# Patient Record
Sex: Male | Born: 1980 | Race: White | Hispanic: No | Marital: Single | Smoking: Current every day smoker
Health system: Southern US, Community
[De-identification: ages and names within clinical notes are randomized; demographics above are authoritative.]

## PROBLEM LIST (undated history)

## (undated) DIAGNOSIS — M545 Low back pain, unspecified: Secondary | ICD-10-CM

## (undated) DIAGNOSIS — L03114 Cellulitis of left upper limb: Secondary | ICD-10-CM

## (undated) DIAGNOSIS — F1910 Other psychoactive substance abuse, uncomplicated: Secondary | ICD-10-CM

## (undated) DIAGNOSIS — G8929 Other chronic pain: Secondary | ICD-10-CM

## (undated) DIAGNOSIS — L02512 Cutaneous abscess of left hand: Secondary | ICD-10-CM

## (undated) HISTORY — PX: FASCIOTOMY: SHX132

---
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
## 2016-04-15 ENCOUNTER — Emergency Department (HOSPITAL_COMMUNITY)

## 2016-04-15 ENCOUNTER — Encounter (HOSPITAL_COMMUNITY): Payer: Self-pay | Admitting: Adult Health

## 2016-04-15 ENCOUNTER — Observation Stay (HOSPITAL_COMMUNITY): Admitting: Anesthesiology

## 2016-04-15 ENCOUNTER — Inpatient Hospital Stay (HOSPITAL_COMMUNITY)
Admission: EM | Admit: 2016-04-15 | Discharge: 2016-04-21 | DRG: 603 | Disposition: A | Discharge status: Home / Self Care | Attending: Internal Medicine | Admitting: Internal Medicine

## 2016-04-15 ENCOUNTER — Encounter (HOSPITAL_COMMUNITY)
Admission: EM | Disposition: A | Discharge status: Home / Self Care | Payer: Self-pay | Source: Home / Self Care | Attending: Internal Medicine

## 2016-04-15 DIAGNOSIS — L03114 Cellulitis of left upper limb: Secondary | ICD-10-CM | POA: Diagnosis not present

## 2016-04-15 DIAGNOSIS — F17200 Nicotine dependence, unspecified, uncomplicated: Secondary | ICD-10-CM | POA: Diagnosis present

## 2016-04-15 DIAGNOSIS — M7989 Other specified soft tissue disorders: Secondary | ICD-10-CM | POA: Diagnosis not present

## 2016-04-15 HISTORY — PX: I & D EXTREMITY: SHX5045

## 2016-04-15 LAB — BASIC METABOLIC PANEL
ANION GAP: 9 (ref 5–15)
BUN: 8 mg/dL (ref 6–20)
CHLORIDE: 104 mmol/L (ref 101–111)
CO2: 26 mmol/L (ref 22–32)
Calcium: 9.3 mg/dL (ref 8.9–10.3)
Creatinine, Ser: 1.02 mg/dL (ref 0.61–1.24)
GFR calc Af Amer: >60 mL/min (ref >60)
GFR calc non Af Amer: >60 mL/min (ref >60)
GLUCOSE: 99 mg/dL (ref 65–99)
POTASSIUM: 3.7 mmol/L (ref 3.5–5.1)
Sodium: 139 mmol/L (ref 135–145)

## 2016-04-15 LAB — CBC
HEMATOCRIT: 44.2 % (ref 39.0–52.0)
Hemoglobin: 13.5 g/dL (ref 13.0–17.0)
MCH: 27 pg (ref 26.0–34.0)
MCHC: 30.5 g/dL (ref 30.0–36.0)
MCV: 88.4 fL (ref 78.0–100.0)
Platelets: 160 10*3/uL (ref 150–400)
RBC: 5 MIL/uL (ref 4.22–5.81)
RDW: 14.9 % (ref 11.5–15.5)
WBC: 5.9 10*3/uL (ref 4.0–10.5)

## 2016-04-15 LAB — CBC WITH DIFFERENTIAL/PLATELET
BASOS ABS: 0 10*3/uL (ref 0.0–0.1)
Basophils Relative: 0 %
EOS PCT: 1 %
Eosinophils Absolute: 0.1 10*3/uL (ref 0.0–0.7)
HEMATOCRIT: 45.6 % (ref 39.0–52.0)
Hemoglobin: 14.6 g/dL (ref 13.0–17.0)
Lymphocytes Relative: 21 %
Lymphs Abs: 1.7 10*3/uL (ref 0.7–4.0)
MCH: 28 pg (ref 26.0–34.0)
MCHC: 32 g/dL (ref 30.0–36.0)
MCV: 87.4 fL (ref 78.0–100.0)
Monocytes Absolute: 0.6 10*3/uL (ref 0.1–1.0)
Monocytes Relative: 8 %
NEUTROS PCT: 70 %
Neutro Abs: 5.6 10*3/uL (ref 1.7–7.7)
Platelets: 177 10*3/uL (ref 150–400)
RBC: 5.22 MIL/uL (ref 4.22–5.81)
RDW: 14.8 % (ref 11.5–15.5)
WBC: 8 10*3/uL (ref 4.0–10.5)

## 2016-04-15 LAB — CREATININE, SERUM
Creatinine, Ser: 0.98 mg/dL (ref 0.61–1.24)
GFR calc non Af Amer: >60 mL/min (ref >60)

## 2016-04-15 SURGERY — IRRIGATION AND DEBRIDEMENT EXTREMITY
Anesthesia: General | Site: Arm Lower | Laterality: Left

## 2016-04-15 MED ORDER — METHOCARBAMOL 500 MG PO TABS
500.0000 mg | ORAL_TABLET | Freq: Four times a day (QID) | ORAL | Status: DC | PRN
  Administered 2016-04-15 – 2016-04-20 (×5): 500 mg via ORAL
  Filled 2016-04-15 (×9): qty 1

## 2016-04-15 MED ORDER — MEPERIDINE HCL 25 MG/ML IJ SOLN: 6.2500 mg | INTRAMUSCULAR | Status: DC | PRN

## 2016-04-15 MED ORDER — HYDROMORPHONE HCL 1 MG/ML IJ SOLN
0.2500 mg | INTRAMUSCULAR | Status: DC | PRN
  Administered 2016-04-15 (×2): 0.5 mg via INTRAVENOUS

## 2016-04-15 MED ORDER — FENTANYL CITRATE (PF) 250 MCG/5ML IJ SOLN
INTRAMUSCULAR | Status: AC
  Filled 2016-04-15: qty 5

## 2016-04-15 MED ORDER — ONDANSETRON HCL 4 MG/2ML IJ SOLN: 4.0000 mg | Freq: Four times a day (QID) | INTRAMUSCULAR | Status: DC | PRN

## 2016-04-15 MED ORDER — BUPIVACAINE HCL (PF) 0.25 % IJ SOLN
INTRAMUSCULAR | Status: DC | PRN
  Administered 2016-04-15: 10 mL

## 2016-04-15 MED ORDER — VANCOMYCIN HCL IN DEXTROSE 1-5 GM/200ML-% IV SOLN
1000.0000 mg | Freq: Three times a day (TID) | INTRAVENOUS | Status: DC
  Administered 2016-04-16 – 2016-04-17 (×5): 1000 mg via INTRAVENOUS
  Filled 2016-04-15 (×7): qty 200

## 2016-04-15 MED ORDER — LACTATED RINGERS IV SOLN
INTRAVENOUS | Status: DC | PRN
  Administered 2016-04-15 (×2): via INTRAVENOUS

## 2016-04-15 MED ORDER — MIDAZOLAM HCL 2 MG/2ML IJ SOLN
INTRAMUSCULAR | Status: AC
  Filled 2016-04-15: qty 2

## 2016-04-15 MED ORDER — LIDOCAINE HCL (CARDIAC) 20 MG/ML IV SOLN
INTRAVENOUS | Status: DC | PRN
  Administered 2016-04-15: 100 mg via INTRATRACHEAL

## 2016-04-15 MED ORDER — HYDROMORPHONE HCL 1 MG/ML IJ SOLN
0.5000 mg | INTRAMUSCULAR | Status: DC | PRN
  Administered 2016-04-15 – 2016-04-16 (×4): 1 mg via INTRAVENOUS
  Filled 2016-04-15 (×4): qty 1

## 2016-04-15 MED ORDER — PROPOFOL 10 MG/ML IV BOLUS
INTRAVENOUS | Status: DC | PRN
  Administered 2016-04-15: 200 mg via INTRAVENOUS

## 2016-04-15 MED ORDER — ONDANSETRON HCL 4 MG PO TABS: 4.0000 mg | ORAL_TABLET | Freq: Four times a day (QID) | ORAL | Status: DC | PRN

## 2016-04-15 MED ORDER — OXYCODONE-ACETAMINOPHEN 5-325 MG PO TABS
1.0000 | ORAL_TABLET | ORAL | Status: DC | PRN
  Administered 2016-04-15 – 2016-04-21 (×33): 2 via ORAL
  Filled 2016-04-15 (×36): qty 2

## 2016-04-15 MED ORDER — SODIUM CHLORIDE 0.9 % IR SOLN
Status: DC | PRN
  Administered 2016-04-15: 3000 mL

## 2016-04-15 MED ORDER — HYDROMORPHONE HCL 1 MG/ML IJ SOLN
INTRAMUSCULAR | Status: AC
  Filled 2016-04-15: qty 1

## 2016-04-15 MED ORDER — ENOXAPARIN SODIUM 40 MG/0.4ML ~~LOC~~ SOLN
40.0000 mg | SUBCUTANEOUS | Status: DC
  Administered 2016-04-16 – 2016-04-21 (×6): 40 mg via SUBCUTANEOUS
  Filled 2016-04-15 (×6): qty 0.4

## 2016-04-15 MED ORDER — LIDOCAINE 2% (20 MG/ML) 5 ML SYRINGE
INTRAMUSCULAR | Status: AC
  Filled 2016-04-15: qty 5

## 2016-04-15 MED ORDER — LACTATED RINGERS IV SOLN
INTRAVENOUS | Status: DC
Start: 2016-04-15 — End: 2016-04-21

## 2016-04-15 MED ORDER — FENTANYL CITRATE (PF) 250 MCG/5ML IJ SOLN
INTRAMUSCULAR | Status: DC | PRN
  Administered 2016-04-15 (×2): 50 ug via INTRAVENOUS

## 2016-04-15 MED ORDER — ONDANSETRON HCL 4 MG/2ML IJ SOLN
INTRAMUSCULAR | Status: AC
  Filled 2016-04-15: qty 2

## 2016-04-15 MED ORDER — HYDRALAZINE HCL 20 MG/ML IJ SOLN: 10.0000 mg | INTRAMUSCULAR | Status: DC | PRN

## 2016-04-15 MED ORDER — TEMAZEPAM 15 MG PO CAPS
15.0000 mg | ORAL_CAPSULE | Freq: Every evening | ORAL | Status: DC | PRN
  Filled 2016-04-15 (×3): qty 1

## 2016-04-15 MED ORDER — BUPIVACAINE HCL (PF) 0.25 % IJ SOLN
INTRAMUSCULAR | Status: AC
Start: 2016-04-15 — End: 2016-04-15
  Filled 2016-04-15: qty 30

## 2016-04-15 MED ORDER — VANCOMYCIN HCL IN DEXTROSE 1-5 GM/200ML-% IV SOLN
1000.0000 mg | Freq: Once | INTRAVENOUS | Status: AC
  Administered 2016-04-15: 1000 mg via INTRAVENOUS
  Filled 2016-04-15: qty 200

## 2016-04-15 MED ORDER — METHOCARBAMOL 1000 MG/10ML IJ SOLN: 500.0000 mg | Freq: Four times a day (QID) | INTRAVENOUS | Status: DC | PRN

## 2016-04-15 MED ORDER — LACTATED RINGERS IV SOLN: INTRAVENOUS | Status: DC

## 2016-04-15 MED ORDER — PROCHLORPERAZINE EDISYLATE 5 MG/ML IJ SOLN: 10.0000 mg | INTRAMUSCULAR | Status: DC | PRN

## 2016-04-15 MED ORDER — SODIUM CHLORIDE 0.9 % IV SOLN
3.0000 g | Freq: Three times a day (TID) | INTRAVENOUS | Status: DC
  Administered 2016-04-15 – 2016-04-17 (×5): 3 g via INTRAVENOUS
  Filled 2016-04-15 (×8): qty 3

## 2016-04-15 MED ORDER — ONDANSETRON HCL 4 MG/2ML IJ SOLN
INTRAMUSCULAR | Status: DC | PRN
  Administered 2016-04-15: 4 mg via INTRAVENOUS

## 2016-04-15 MED ORDER — DIPHENHYDRAMINE HCL 25 MG PO CAPS
25.0000 mg | ORAL_CAPSULE | Freq: Four times a day (QID) | ORAL | Status: DC | PRN
  Administered 2016-04-19: 50 mg via ORAL
  Filled 2016-04-15: qty 2

## 2016-04-15 MED ORDER — 0.9 % SODIUM CHLORIDE (POUR BTL) OPTIME
TOPICAL | Status: DC | PRN
  Administered 2016-04-15: 1000 mL

## 2016-04-15 MED ORDER — PROPOFOL 10 MG/ML IV BOLUS
INTRAVENOUS | Status: AC
  Filled 2016-04-15: qty 20

## 2016-04-15 MED ORDER — MORPHINE SULFATE (PF) 4 MG/ML IV SOLN
4.0000 mg | Freq: Once | INTRAVENOUS | Status: AC
  Administered 2016-04-15: 4 mg via INTRAVENOUS
  Filled 2016-04-15: qty 1

## 2016-04-15 MED ORDER — VITAMIN C 500 MG PO TABS
1000.0000 mg | ORAL_TABLET | Freq: Every day | ORAL | Status: DC
  Administered 2016-04-16 – 2016-04-21 (×6): 1000 mg via ORAL
  Filled 2016-04-15 (×6): qty 2

## 2016-04-15 SURGICAL SUPPLY — 60 items
BANDAGE ACE 3X5.8 VEL STRL LF (GAUZE/BANDAGES/DRESSINGS) ×3 IMPLANT
BANDAGE ACE 4X5 VEL STRL LF (GAUZE/BANDAGES/DRESSINGS) ×3 IMPLANT
BANDAGE COBAN STERILE 2 (GAUZE/BANDAGES/DRESSINGS) IMPLANT
BANDAGE ELASTIC 3 VELCRO ST LF (GAUZE/BANDAGES/DRESSINGS) ×3 IMPLANT
BANDAGE ELASTIC 4 VELCRO ST LF (GAUZE/BANDAGES/DRESSINGS) ×3 IMPLANT
BNDG COHESIVE 1X5 TAN STRL LF (GAUZE/BANDAGES/DRESSINGS) IMPLANT
BNDG CONFORM 2 STRL LF (GAUZE/BANDAGES/DRESSINGS) IMPLANT
BNDG ESMARK 4X9 LF (GAUZE/BANDAGES/DRESSINGS) IMPLANT
BNDG GAUZE ELAST 4 BULKY (GAUZE/BANDAGES/DRESSINGS) ×3 IMPLANT
CORDS BIPOLAR (ELECTRODE) ×3 IMPLANT
COVER SURGICAL LIGHT HANDLE (MISCELLANEOUS) ×6 IMPLANT
CUFF TOURNIQUET SINGLE 18IN (TOURNIQUET CUFF) ×3 IMPLANT
DECANTER SPIKE VIAL GLASS SM (MISCELLANEOUS) IMPLANT
DRAIN PENROSE 1/4X12 LTX STRL (WOUND CARE) IMPLANT
DRAPE SURG 17X23 STRL (DRAPES) ×3 IMPLANT
DRSG ADAPTIC 3X8 NADH LF (GAUZE/BANDAGES/DRESSINGS) ×3 IMPLANT
DRSG EMULSION OIL 3X3 NADH (GAUZE/BANDAGES/DRESSINGS) ×3 IMPLANT
DRSG PAD ABDOMINAL 8X10 ST (GAUZE/BANDAGES/DRESSINGS) ×3 IMPLANT
FACESHIELD WRAPAROUND (MASK) ×3 IMPLANT
GAUZE IODOFORM PACK 1/2 7832 (GAUZE/BANDAGES/DRESSINGS) ×6 IMPLANT
GAUZE SPONGE 4X4 12PLY STRL (GAUZE/BANDAGES/DRESSINGS) ×3 IMPLANT
GAUZE XEROFORM 1X8 LF (GAUZE/BANDAGES/DRESSINGS) ×3 IMPLANT
GLOVE BIO SURGEON STRL SZ7.5 (GLOVE) ×3 IMPLANT
GLOVE BIOGEL PI IND STRL 8 (GLOVE) ×1 IMPLANT
GLOVE BIOGEL PI INDICATOR 8 (GLOVE) ×2
GLOVE SURG SS PI 7.5 STRL IVOR (GLOVE) ×3 IMPLANT
GLOVE SURG SS PI 8.0 STRL IVOR (GLOVE) ×3 IMPLANT
GOWN STRL REUS W/ TWL LRG LVL3 (GOWN DISPOSABLE) ×1 IMPLANT
GOWN STRL REUS W/TWL LRG LVL3 (GOWN DISPOSABLE) ×2
KIT BASIN OR (CUSTOM PROCEDURE TRAY) ×3 IMPLANT
KIT ROOM TURNOVER OR (KITS) ×3 IMPLANT
LOOP VESSEL MAXI BLUE (MISCELLANEOUS) IMPLANT
MANIFOLD NEPTUNE II (INSTRUMENTS) ×3 IMPLANT
NEEDLE HYPO 25X1 1.5 SAFETY (NEEDLE) ×3 IMPLANT
NS IRRIG 1000ML POUR BTL (IV SOLUTION) ×3 IMPLANT
PACK ORTHO EXTREMITY (CUSTOM PROCEDURE TRAY) ×3 IMPLANT
PAD ARMBOARD 7.5X6 YLW CONV (MISCELLANEOUS) ×6 IMPLANT
PAD CAST 4YDX4 CTTN HI CHSV (CAST SUPPLIES) ×1 IMPLANT
PADDING CAST COTTON 4X4 STRL (CAST SUPPLIES) ×2
SCRUB BETADINE 4OZ XXX (MISCELLANEOUS) ×3 IMPLANT
SET CYSTO W/LG BORE CLAMP LF (SET/KITS/TRAYS/PACK) ×3 IMPLANT
SOLUTION BETADINE 4OZ (MISCELLANEOUS) ×6 IMPLANT
SPLINT PLASTER EXTRA FAST 3X15 (CAST SUPPLIES) ×2
SPLINT PLASTER GYPS XFAST 3X15 (CAST SUPPLIES) ×1 IMPLANT
SPONGE GAUZE 4X4 12PLY STER LF (GAUZE/BANDAGES/DRESSINGS) ×3 IMPLANT
SPONGE LAP 4X18 X RAY DECT (DISPOSABLE) ×3 IMPLANT
SUT ETHILON 4 0 P 3 18 (SUTURE) IMPLANT
SUT ETHILON 4 0 PS 2 18 (SUTURE) ×3 IMPLANT
SUT MON AB 5-0 P3 18 (SUTURE) IMPLANT
SWAB CULTURE LIQUID MINI MALE (MISCELLANEOUS) ×3 IMPLANT
SYR CONTROL 10ML LL (SYRINGE) IMPLANT
TOWEL OR 17X24 6PK STRL BLUE (TOWEL DISPOSABLE) ×3 IMPLANT
TOWEL OR 17X26 10 PK STRL BLUE (TOWEL DISPOSABLE) ×3 IMPLANT
TUBE ANAEROBIC SPECIMEN COL (MISCELLANEOUS) ×3 IMPLANT
TUBE CONNECTING 12'X1/4 (SUCTIONS) ×1
TUBE CONNECTING 12X1/4 (SUCTIONS) ×2 IMPLANT
TUBE FEEDING 5FR 15 INCH (TUBING) IMPLANT
UNDERPAD 30X30 INCONTINENT (UNDERPADS AND DIAPERS) ×6 IMPLANT
WATER STERILE IRR 1000ML POUR (IV SOLUTION) ×3 IMPLANT
YANKAUER SUCT BULB TIP NO VENT (SUCTIONS) ×3 IMPLANT

## 2016-04-15 NOTE — Anesthesia Postprocedure Evaluation (Signed)
Anesthesia Post Note  Patient: Derrick Rivers  Procedure(s) Performed: Procedure(s) (LRB): IRRIGATION AND DEBRIDEMENT LEFT HAND AND  FOREARM (Left)  Patient location during evaluation: PACU Anesthesia Type: General Level of consciousness: awake and alert Pain management: pain level controlled Vital Signs Assessment: post-procedure vital signs reviewed and stable Respiratory status: spontaneous breathing, nonlabored ventilation, respiratory function stable and patient connected to nasal cannula oxygen Cardiovascular status: blood pressure returned to baseline and stable Postop Assessment: no signs of nausea or vomiting Anesthetic complications: no    Last Vitals:  Filed Vitals:   04/15/16 2200 04/15/16 2205  BP: 175/98 158/94  Pulse: 83 82  Temp:  36.5 C  Resp: 16 15    Last Pain:  Filed Vitals:   04/15/16 2208  PainSc: 9                  Shelton SilvasKevin D Morrill Bomkamp

## 2016-04-15 NOTE — Anesthesia Procedure Notes (Signed)
Procedure Name: LMA Insertion Date/Time: 04/15/2016 8:55 PM Performed by: Melina SchoolsBANKS, Briella Hobday J Pre-anesthesia Checklist: Patient identified, Emergency Drugs available, Suction available, Patient being monitored and Timeout performed Patient Re-evaluated:Patient Re-evaluated prior to inductionOxygen Delivery Method: Circle system utilized Preoxygenation: Pre-oxygenation with 100% oxygen Intubation Type: IV induction Ventilation: Mask ventilation without difficulty LMA Size: 4.0 Number of attempts: 1 Placement Confirmation: positive ETCO2 and breath sounds checked- equal and bilateral Tube secured with: Tape Dental Injury: Teeth and Oropharynx as per pre-operative assessment

## 2016-04-15 NOTE — Discharge Instructions (Signed)

## 2016-04-15 NOTE — Progress Notes (Signed)
Pharmacy Antibiotic Note  Derrick Rivers is a 35 y.o. male admitted on 04/15/2016 with left hand infection.  Pharmacy has been consulted for Vancomycin and Unasyn dosing. Pt is s/p I&D today. WBC wnl, nCrCl > 100 mL/min. Patient has already received a dose of Vancomycin 1 gm at 1902  Plan: -Start Vancomycin 1 gm IV Q 8 hours -Unasyn 3 gm IV Q 8 hours -Monitor CBC, renal fx, cultures and clinical progress   Weight: 245 lb (111.131 kg)  Temp (24hrs), Avg:97.9 F (36.6 C), Min:97.7 F (36.5 C), Max:98.2 F (36.8 C)   Recent Labs Lab 04/15/16 1659  WBC 8.0  CREATININE 1.02    CrCl cannot be calculated (Unknown ideal weight.).    No Known Allergies  Antimicrobials this admission: 5/23 Vancomycin>> 5/23 Unasyn>>   Dose adjustments this admission: None   Microbiology results: 5/23 Abscess>>  Thank you for allowing pharmacy to be a part of this patient's care.  Derrick Rivers, PharmD., BCPS Clinical Pharmacist Pager [phone]

## 2016-04-15 NOTE — ED Provider Notes (Signed)
CSN: [id number]     Arrival date & time 04/15/16  1549 History   First MD Initiated Contact with Patient 04/15/16 1739     Chief Complaint  Patient presents with  . Arm Swelling     (Consider location/radiation/quality/duration/timing/severity/associated sxs/prior Treatment) HPI  Pt presenting with c/o left arm and hand swelling, pain and redness.  Pt states he first noted the top of his hand swollen when he awoke from sleep yesterday morning.  [facility] [place] [facility] was given keflex and bactrim yesterday - has had a total of 4 doses- states now the swelling and forearm are red and more swollen and more painful.  No fever/chills.  Does not know of any injury or break in skin where the redness started.  Pain with moving his hand and fingers.  He has had hx of fasciotomy.   History reviewed. No pertinent past medical history. Past Surgical History  Procedure Laterality Date  . Fasciotomy    . I&d extremity Left 04/15/2016    Procedure: IRRIGATION AND DEBRIDEMENT LEFT HAND AND  FOREARM;  Surgeon: Betha Loa, MD;  Location: [facility] OR;  Service: Orthopedics;  Laterality: Left;   History reviewed. No pertinent family history. Social History  Substance Use Topics  . Smoking status: Current Some Day Smoker  . Smokeless tobacco: None  . Alcohol Use: No    Review of Systems  ROS reviewed and all otherwise negative except for mentioned in HPI    Allergies  Review of patient's allergies indicates no known allergies.  Home Medications   Prior to Admission medications   Not on File   BP 130/82 mmHg  Pulse 79  Temp(Src) 98.6 F (37 C) (Oral)  Resp 16  Wt 245 lb (111.131 kg)  SpO2 99%  Vitals reviewed Physical Exam  Physical Examination: General appearance - alert, well appearing, and in no distress Mental status - alert, oriented to person, place, and time Eyes - no conjunctival injection, no scleral icterus Chest - clear to auscultation, no wheezes, rales or rhonchi,  symmetric air entry Heart - normal rate, regular rhythm, normal S1, S2, no murmurs, rubs, clicks or gallops Neurological - alert, oriented, normal speech, sensation intact in fingers distally, able to flex and extend finger but with significant pain Musculoskeletal - no joint tenderness, deformity or swelling Extremities - peripheral pulses normal, no pedal edema, no clubbing or cyanosis Skin - normal coloration and turgor, no rashes  ED Course  Procedures (including critical care time) Labs Review Labs Reviewed  CBC - Abnormal; Notable for the following:    MCHC 29.9 (*)    All other components within normal limits  AEROBIC/ANAEROBIC CULTURE(SURG SPECIMEN)(NOT AT [facility])  MRSA PCR SCREENING  CBC WITH DIFFERENTIAL/PLATELET  BASIC METABOLIC PANEL  CBC  CREATININE, SERUM  CBC    Imaging Review Dg Forearm Left  04/15/2016  CLINICAL DATA:  Swelling in the forearm. EXAM: LEFT FOREARM - 2 VIEW COMPARISON:  None. FINDINGS: The abnormal swelling in the dorsum of the hand and wrist continues along the dorsum and radial side of the forearm, where there is infiltration of the subcutaneous tissues along with soft tissue thickening. No foreign body observed. No underlying bony abnormality. IMPRESSION: 1. Abnormal edema tracking in the dorsum and radial side of the forearm. Cellulitis is not excluded. No gas in the soft tissues noted. Electronically Signed   By: Gaylyn Rong M.D.   On: 04/15/2016 18:15   Dg Hand Complete Left  04/15/2016  CLINICAL DATA:  Swelling and pain in the left hand beginning today. Tightness extending up to the elbow. Swelling. History of cellulitis. EXAM: LEFT HAND - COMPLETE 3+ VIEW COMPARISON:  None. FINDINGS: The patient flexed his fingers during imaging, reducing diagnostic sensitivity and specificity in assessing the fingers. I do not observe a foreign body. No bony destructive findings or fracture observed. There is extensive soft tissue swelling along the dorsum of  the hand without gas tracking in the soft tissues. This tracks into the dorsum of the wrist as well. Lateral sclerosis in the distal phalanx of the thumb, likely incidental. IMPRESSION: 1. Prominent dorsal soft tissue swelling in the hand and wrist, without underlying bony abnormality or visible foreign body. Electronically Signed   By: Gaylyn RongWalter  Liebkemann M.D.   On: 04/15/2016 18:14   I have personally reviewed and evaluated these images and lab results as part of my medical decision-making.   EKG Interpretation None      MDM   Final diagnoses:  None  left hand and forearm cellulitis  Pt presenting with swelling, redness and pain of left hand and forearm.  Labs obtained, WBC reassuring.  Pt started on IV abx, d/w Dr. Merlyn LotKuzma who is taking patient to the OR for washout.  D/w medicine for admission for IV abx.      6:05 PM went to see patient, he is in xray  6:45 PM d/w Dr. Merlyn LotKuzma, he is in the OR but will come see the patient.  IV abx ordered, pt to remain NPO.    7:49 PM Dr. Merlyn LotKuzma is going to take patient to the OR, requests hospitalist admission.    8:15 PM d/w Dr. Antionette Charpyd, hospitalist, for admission to med/surg bed.    Jerelyn ScottMartha Linker, MD 04/16/16 [id number]

## 2016-04-15 NOTE — ED Notes (Signed)
[facility] arm swelling began yesterday, started on Keflex and bactrim yesterday with no relief, swelling has increased. Arm tight, painful and very hot to touch. Swollen to elbow at this time. WBC at facility 12.2, jhx of fasciotomy in past.

## 2016-04-15 NOTE — Brief Op Note (Signed)
04/15/2016  9:38 PM  PATIENT:  Derrick Rivers  35 y.o. male  PRE-OPERATIVE DIAGNOSIS:  infection left hand  POST-OPERATIVE DIAGNOSIS:  infection left hand  PROCEDURE:  Procedure(s): IRRIGATION AND DEBRIDEMENT LEFT HAND AND  FOREARM (Left)  SURGEON:  Surgeon(s) and Role:    * Betha LoaKevin Sheikh Leverich, MD - Primary  PHYSICIAN ASSISTANT:   ASSISTANTS: none   ANESTHESIA:   general  EBL:  Total I/O In: 1000 [I.V.:1000] Out: 5 [Blood:5]  BLOOD ADMINISTERED:none  DRAINS: iodoform packing  LOCAL MEDICATIONS USED:  MARCAINE     SPECIMEN:  Source of Specimen:  left hand  DISPOSITION OF SPECIMEN:  micro  COUNTS:  YES  TOURNIQUET:   Total Tourniquet Time Documented: Upper Arm (Left) - 26 minutes Total: Upper Arm (Left) - 26 minutes   DICTATION: .Other Dictation: Dictation Number [id number]  PLAN OF CARE: Admit to inpatient   PATIENT DISPOSITION:  PACU - hemodynamically stable.   Delay start of Pharmacological VTE agent (>24hrs) due to surgical blood loss or risk of bleeding: no

## 2016-04-15 NOTE — Op Note (Signed)
[id number] 

## 2016-04-15 NOTE — Transfer of Care (Signed)
Immediate Anesthesia Transfer of Care Note  Patient: Derrick Rivers  Procedure(s) Performed: Procedure(s): IRRIGATION AND DEBRIDEMENT LEFT HAND AND  FOREARM (Left)  Patient Location: PACU  Anesthesia Type:General  Level of Consciousness: awake, oriented, patient cooperative and responds to stimulation  Airway & Oxygen Therapy: Patient Spontanous Breathing and Patient connected to nasal cannula oxygen  Post-op Assessment: Report given to RN, Post -op Vital signs reviewed and stable and Patient moving all extremities X 4  Post vital signs: Reviewed and stable  Last Vitals:  Filed Vitals:   04/15/16 1930 04/15/16 2152  BP: 122/86 144/108  Pulse: 77   Temp:  36.5 C  Resp:      Last Pain:  Filed Vitals:   04/15/16 2153  PainSc: 9          Complications: No apparent anesthesia complications

## 2016-04-15 NOTE — H&P (Signed)
Derrick Rivers is an 35 y.o. male.   Chief Complaint: left hand swelling/pain HPI: 35 yo rhd male present with guard from [name] states he began to have swelling and pain of left hand starting yesterday.  Started on bactrim and has had worsening of symptoms.  Describes a throbbing burning type pain of 9/10 severity.  This has progressed into forearm.  He does not remember any injuries or wounds.  No needle sticks or bites known.  Has had fevers, chills, and nausea and did not sleep last night.  Case discussed with Alfonzo Beers, MD and her note from 04/15/16 reviewed. Xrays viewed and interpreted by me: hand and forearm films show soft tissue swelling with no fracture, dislocation, radioopaque foreign body.  CT from [facility] yesterday shows soft tissue swelling and small fluid collection on dorsum of hand.  Radiologist interpretation is soft tissue swelling consistent with cellulitis with possible abscess collection. Labs reviewed: WBC 8.0  Allergies: No Known Allergies  History reviewed. No pertinent past medical history.  Past Surgical History  Procedure Laterality Date  . Fasciotomy      Family History: History reviewed. No pertinent family history.  Social History:   reports that he has been smoking.  He does not have any smokeless tobacco history on file. He reports that he does not drink alcohol or use illicit drugs.  Medications:  (Not in a hospital admission)  Results for orders placed or performed during the hospital encounter of 04/15/16 (from the past 48 hour(s))  CBC with Differential     Status: None   Collection Time: 04/15/16  4:59 PM  Result Value Ref Range   WBC 8.0 4.0 - 10.5 K/uL   RBC 5.22 4.22 - 5.81 MIL/uL   Hemoglobin 14.6 13.0 - 17.0 g/dL   HCT 45.6 39.0 - 52.0 %   MCV 87.4 78.0 - 100.0 fL   MCH 28.0 26.0 - 34.0 pg   MCHC 32.0 30.0 - 36.0 g/dL   RDW 14.8 11.5 - 15.5 %   Platelets 177 150 - 400 K/uL   Neutrophils Relative % 70 %    Neutro Abs 5.6 1.7 - 7.7 K/uL   Lymphocytes Relative 21 %   Lymphs Abs 1.7 0.7 - 4.0 K/uL   Monocytes Relative 8 %   Monocytes Absolute 0.6 0.1 - 1.0 K/uL   Eosinophils Relative 1 %   Eosinophils Absolute 0.1 0.0 - 0.7 K/uL   Basophils Relative 0 %   Basophils Absolute 0.0 0.0 - 0.1 K/uL  Basic metabolic panel     Status: None   Collection Time: 04/15/16  4:59 PM  Result Value Ref Range   Sodium 139 135 - 145 mmol/L   Potassium 3.7 3.5 - 5.1 mmol/L   Chloride 104 101 - 111 mmol/L   CO2 26 22 - 32 mmol/L   Glucose, Bld 99 65 - 99 mg/dL   BUN 8 6 - 20 mg/dL   Creatinine, Ser 1.02 0.61 - 1.24 mg/dL   Calcium 9.3 8.9 - 10.3 mg/dL   GFR calc non Af Amer >60 >60 mL/min   GFR calc Af Amer >60 >60 mL/min    Comment: (NOTE) The eGFR has been calculated using the CKD EPI equation. This calculation has not been validated in all clinical situations. eGFR's persistently <60 mL/min signify possible Chronic Kidney Disease.    Anion gap 9 5 - 15    Dg Forearm Left  04/15/2016  CLINICAL DATA:  Swelling in the forearm.  EXAM: LEFT FOREARM - 2 VIEW COMPARISON:  None. FINDINGS: The abnormal swelling in the dorsum of the hand and wrist continues along the dorsum and radial side of the forearm, where there is infiltration of the subcutaneous tissues along with soft tissue thickening. No foreign body observed. No underlying bony abnormality. IMPRESSION: 1. Abnormal edema tracking in the dorsum and radial side of the forearm. Cellulitis is not excluded. No gas in the soft tissues noted. Electronically Signed   By: Van Clines M.D.   On: 04/15/2016 18:15   Dg Hand Complete Left  04/15/2016  CLINICAL DATA:  Swelling and pain in the left hand beginning today. Tightness extending up to the elbow. Swelling. History of cellulitis. EXAM: LEFT HAND - COMPLETE 3+ VIEW COMPARISON:  None. FINDINGS: The patient flexed his fingers during imaging, reducing diagnostic sensitivity and specificity in assessing  the fingers. I do not observe a foreign body. No bony destructive findings or fracture observed. There is extensive soft tissue swelling along the dorsum of the hand without gas tracking in the soft tissues. This tracks into the dorsum of the wrist as well. Lateral sclerosis in the distal phalanx of the thumb, likely incidental. IMPRESSION: 1. Prominent dorsal soft tissue swelling in the hand and wrist, without underlying bony abnormality or visible foreign body. Electronically Signed   By: Van Clines M.D.   On: 04/15/2016 18:14     Pertinent items are noted in HPI. Review of Systems: No chest pain, shortness of breath, vomiting, diarrhea, constipation, easy bleeding or bruising, headaches, dizziness, vision changes, fainting.   Blood pressure 122/86, pulse 77, temperature 98.2 F (36.8 C), temperature source Oral, resp. rate 18, weight 111.131 kg (245 lb), SpO2 95 %.  General appearance: alert, cooperative and appears stated age Head: Normocephalic, without obvious abnormality, atraumatic Neck: supple, symmetrical, trachea midline Resp: clear to auscultation bilaterally Cardio: regular rate and rhythm GI: non-tender Extremities: intact sensation and capillary refill all digits.  +epl/fpl/io.  Decreased motion in left hand due to swelling and pain. Pain on dorsum of hand mostly.  ttp dorsum of hand and less so volarly.  Tender in dorsal forearm.  Streaking up ulnar side of forearm.  Compartments soft.  Previous surgical scars on hand and forearm.  Two small wounds on dorsum of hand and scratches ulnar side of distal forearm. Pulses: 2+ and symmetric Skin: Skin color, texture, turgor normal. No rashes or lesions Neurologic: Grossly normal Incision/Wound: As above  Assessment/Plan Left hand/forearm abscess.  Recommend OR for incision and drainage.  Risks, benefits, and alternatives of surgery were discussed and the patient agrees with the plan of care.   [name] 04/15/2016,  7:56 PM

## 2016-04-15 NOTE — ED Notes (Signed)
Dr Merlyn Lotkuzma here pt will be going to the or

## 2016-04-15 NOTE — Op Note (Signed)
Derrick Rivers, Derrick Rivers NO.:  [id number]  MEDICAL RECORD NO.:  [id number]  LOCATION:  [id number]                        FACILITY:  [facility]  PHYSICIAN:  Betha Loa, MD        DATE OF BIRTH:  01-30-1981  DATE OF PROCEDURE:  04/15/2016 DATE OF DISCHARGE:                              OPERATIVE REPORT   PREOPERATIVE DIAGNOSIS:  Left hand and forearm infection.  POSTOPERATIVE DIAGNOSIS:  Left hand and forearm infection.  PROCEDURE:  Incision and drainage of left hand and distal forearm through separate incisions.  SURGEON:  Betha Loa, MD.  ASSISTANT:  None.  ANESTHESIA:  General IV.  FLUIDS:  Per anesthesia flow sheet.  ESTIMATED BLOOD LOSS:  Minimal.  COMPLICATIONS:  None.  SPECIMENS:  Cultures to Micro.  TOURNIQUET TIME:  26 minutes.  DISPOSITION:  Stable to PACU.  INDICATIONS FOR PROCEDURE:  Derrick Rivers is a 35 year old right-hand dominant male who states yesterday he began to have swelling, pain, and erythema of the left hand.  He was seen and was started on Bactrim. This has progressively worsened.  He did not sleep last night.  He has had fevers and chills.  He noted increasing swelling and erythema coursing up the forearm.  Today, he was present at the emergency department where he was evaluated.  I was consulted for management of injury.  I recommended operative incision and drainage.  Risks, benefits, and alternatives of surgery were discussed including the risk of blood loss; infection; damage to nerves, vessels, tendons, ligaments, bone; failure of surgery; need for additional surgery; complications with wound healing; continued pain; continued infection; need for repeat irrigation and debridement.  He voiced understanding of these risks and elected to proceed.  OPERATIVE COURSE:  After being identified preoperatively by myself, the patient and I agreed upon the procedure and site of the procedure. Surgical site was marked.  The risks,  benefits, and alternatives of surgery were reviewed; and he wished to proceed.  Surgical consent was signed.  He was transferred to the operating room and placed on the operating room table in supine position with the left upper extremity on arm board.  General anesthesia was induced by Anesthesiology.  Left upper extremity was prepped and draped in normal sterile orthopedic fashion.  Surgical pause was performed between surgeons, Anesthesia, operating staff; and all were in agreement as to the patient, procedure, and site of procedure.  Tourniquet at the proximal aspect of the extremity was inflated to 250 mmHg after exsanguination of the limb with an Esmarch bandage.  An incision was made on the dorsum of the hand and carried into the subcutaneous tissues by spreading technique.  There was significant scar in the subcutaneous tissues.  This is presumably from his previous fasciotomies.  There was thin watery fluid but no gross purulence.  The area was spread across the dorsum of the hand.  The extensor tendons were visualized.  The area underneath the tendons was accessed, and there was no purulence in this area.  This is the area where a collection of fluid was noted on his CT scan from yesterday. There is erythema on the dorsal ulnar aspect of the forearm.  An  incision was made in this area as well.  This was carried into subcutaneous tissues by spreading technique.  Bipolar electrocautery was used to obtain hemostasis.  There was no gross purulence.  There was bloody fluid.  His compartments were all soft.  The wounds were both copiously irrigated with sterile saline by cysto-tubing.  They were then packed with 0.5 inch iodoform gauze.  They were injected with 10 mL of 0.25% plain Marcaine to aid in postoperative analgesia.  They were then dressed with sterile 4x4s and ABD and wrapped with Kerlix.  A volar splint was placed and wrapped with Kerlix and Ace bandage.  Tourniquet was  deflated at 26 minutes.  Fingertips were pink with brisk capillary refill after deflation of the tourniquet.  Operative drapes were broken down.  The patient was awoken from anesthesia safely.  He was transferred back to stretcher and was taken to PACU in stable condition. He will be admitted by the hospitalist for IV antibiotics.  We will start hydrotherapy in 2-3 days.  This can be continued as an outpatient in our office after his discharge.  He could be discharged when he is afebrile, his white count is normal, and the streaking into the forearm resolves.     Betha LoaKevin Rickardo Brinegar, MD     KK/MEDQ  D:  04/15/2016  T:  04/15/2016  Job:  [id number]

## 2016-04-15 NOTE — H&P (Signed)
History and Physical    Derrick Rivers WUJ:[id number] DOB: 12-26-1980 DOA: 04/15/2016  PCP: No primary care provider on file.   [facility] coming from: [facility]  Chief Complaint: Left hand and forearm pain and swelling  HPI: Derrick Rivers is a 35 y.o. male with medical history significant for skin and soft-tissue infections involving the bilateral upper extremities s/p fasciotomy to LUE who presents to the ED with two days of swelling, redness, and pain to the left dorsal hand and forearm. He endorses subjective fever/chills and nausea associated with this. He denies recent trauma to the site, needle puncture, or insect bite. CBC was reportedly drawn at the jail the day prior to admission and featured a leukocytosis to 12,200. He was started on Keflex and Bactrim, but the swelling and erythema have continued to progress. Patient denies lightheadedness, chest pain, palpitations, abdominal pain, vomiting, or diarrhea.    ED Course: Upon arrival to the ED, patient is found to be afebrile, saturating well on room air, and with remaining vitals stable. Basic lab work, including CBC and chem panel are unremarkable. Radiograph of the left hand and forearm demonstrate soft-tissue edema with possible abscess. A dose of vancomycin was administered empirically and symptomatic care was provided with IV morphine. Hand surgery was consulted by the EDP and the patient was taken back to the OR for I&D. Admission to the medicine service for continued antibiotics was requested.    Review of Systems:  All other systems reviewed and apart from HPI, are negative.  History reviewed. No pertinent past medical history.  Past Surgical History  Procedure Laterality Date  . Fasciotomy       reports that he has been smoking.  He does not have any smokeless tobacco history on file. He reports that he does not drink alcohol or use illicit drugs.  No Known Allergies  History reviewed. No pertinent family history.   Prior  to Admission medications   Not on File    Physical Exam: Filed Vitals:   04/15/16 2152 04/15/16 2200 04/15/16 2205 04/15/16 2236  BP: 144/108 175/98 158/94 132/90  Pulse:  83 82 71  Temp: 97.7 F (36.5 C)  97.7 F (36.5 C) 97.9 F (36.6 C)  TempSrc:    Oral  Resp:  16 15 16   Weight:      SpO2: 94% 97% 96% 97%      Constitutional: NAD, calm, comfortable Eyes: PERTLA, lids and conjunctivae normal ENMT: Mucous membranes are moist. Posterior pharynx clear of any exudate or lesions.   Neck: normal, supple, no masses, no thyromegaly Respiratory: clear to auscultation bilaterally, no wheezing, no crackles. Normal respiratory effort.    Cardiovascular: S1 & S2 heard, regular rate and rhythm, no significant murmurs / rubs / gallops. No significant JVD. Abdomen: No distension, no tenderness, no masses palpated. Bowel sounds normal.  Musculoskeletal: no clubbing / cyanosis. Left hand and forearm in clean surgical dressings. Normal muscle tone.  Skin: no significant rashes, lesions, or ulcers on exposed surfaces. Warm, dry, well-perfused. Neurologic: CN 2-12 grossly intact. Sensation intact, DTR normal. Strength 5/5 in all 4 limbs.  Psychiatric: Normal judgment and insight. Alert and oriented x 3. Normal mood and affect.     Labs on Admission: I have personally reviewed following labs and imaging studies  CBC:  Recent Labs Lab 04/15/16 1659 04/15/16 2240  WBC 8.0 5.9  NEUTROABS 5.6  --   HGB 14.6 13.5  HCT 45.6 44.2  MCV 87.4 88.4  PLT 177 160  Basic Metabolic Panel:  Recent Labs Lab 04/15/16 1659  NA 139  K 3.7  CL 104  CO2 26  GLUCOSE 99  BUN 8  CREATININE 1.02  CALCIUM 9.3   GFR: CrCl cannot be calculated (Unknown ideal weight.). Liver Function Tests: No results for input(s): AST, ALT, ALKPHOS, BILITOT, PROT, ALBUMIN in the last 168 hours. No results for input(s): LIPASE, AMYLASE in the last 168 hours. No results for input(s): AMMONIA in the last 168  hours. Coagulation Profile: No results for input(s): INR, PROTIME in the last 168 hours. Cardiac Enzymes: No results for input(s): CKTOTAL, CKMB, CKMBINDEX, TROPONINI in the last 168 hours. BNP (last 3 results) No results for input(s): PROBNP in the last 8760 hours. HbA1C: No results for input(s): HGBA1C in the last 72 hours. CBG: No results for input(s): GLUCAP in the last 168 hours. Lipid Profile: No results for input(s): CHOL, HDL, LDLCALC, TRIG, CHOLHDL, LDLDIRECT in the last 72 hours. Thyroid Function Tests: No results for input(s): TSH, T4TOTAL, FREET4, T3FREE, THYROIDAB in the last 72 hours. Anemia Panel: No results for input(s): VITAMINB12, FOLATE, FERRITIN, TIBC, IRON, RETICCTPCT in the last 72 hours. Urine analysis: No results found for: COLORURINE, APPEARANCEUR, LABSPEC, PHURINE, GLUCOSEU, HGBUR, BILIRUBINUR, KETONESUR, PROTEINUR, UROBILINOGEN, NITRITE, LEUKOCYTESUR Sepsis Labs: @LABRCNTIP (procalcitonin:4,lacticidven:4) )No results found for this or any previous visit (from the past 240 hour(s)).   Radiological Exams on Admission: Dg Forearm Left  04/15/2016  CLINICAL DATA:  Swelling in the forearm. EXAM: LEFT FOREARM - 2 VIEW COMPARISON:  None. FINDINGS: The abnormal swelling in the dorsum of the hand and wrist continues along the dorsum and radial side of the forearm, where there is infiltration of the subcutaneous tissues along with soft tissue thickening. No foreign body observed. No underlying bony abnormality. IMPRESSION: 1. Abnormal edema tracking in the dorsum and radial side of the forearm. Cellulitis is not excluded. No gas in the soft tissues noted. Electronically Signed   By: Gaylyn Rong M.D.   On: 04/15/2016 18:15   Dg Hand Complete Left  04/15/2016  CLINICAL DATA:  Swelling and pain in the left hand beginning today. Tightness extending up to the elbow. Swelling. History of cellulitis. EXAM: LEFT HAND - COMPLETE 3+ VIEW COMPARISON:  None. FINDINGS: The  patient flexed his fingers during imaging, reducing diagnostic sensitivity and specificity in assessing the fingers. I do not observe a foreign body. No bony destructive findings or fracture observed. There is extensive soft tissue swelling along the dorsum of the hand without gas tracking in the soft tissues. This tracks into the dorsum of the wrist as well. Lateral sclerosis in the distal phalanx of the thumb, likely incidental. IMPRESSION: 1. Prominent dorsal soft tissue swelling in the hand and wrist, without underlying bony abnormality or visible foreign body. Electronically Signed   By: Gaylyn Rong M.D.   On: 04/15/2016 18:14    EKG: Not performed, will obtain as appropriate.   Assessment/Plan  1. Cellulitis, LUE - Not septic, no systemic features  - Taken from ED to OR by Dr. Merlyn Lot for I&D  - Intraoperative culture is incubating; gram stain with rare mononuclear wbc, no organisms seen  - Empiric abx with vancomycin and Unasyn while awaiting culture data  - Pain-control prn; elevate arm  - Follow cultures, clinical progress  DVT prophylaxis: sq Lovenox, SCD  Code Status: Full  Family Communication: Discussed with patient  Disposition Plan: Observe on med/surg  Consults called: Hand surgery   Admission status: Observation  Briscoe Deutscher, MD [facility] Pager [phone]  If 7PM-7AM, please contact night-coverage [email or web address] Password [facility]  04/15/2016, 11:44 PM

## 2016-04-15 NOTE — Anesthesia Preprocedure Evaluation (Addendum)
Anesthesia Evaluation  Patient identified by MRN, date of birth, ID band Patient awake    Reviewed: Allergy & Precautions, NPO status , Patient's Chart, lab work & pertinent test results  Airway Mallampati: I       Dental  (+) Edentulous Upper, Dental Advisory Given   Pulmonary Current Smoker,    breath sounds clear to auscultation       Cardiovascular negative cardio ROS   Rhythm:Regular Rate:Normal     Neuro/Psych negative neurological ROS  negative psych ROS   GI/Hepatic negative GI ROS, Neg liver ROS,   Endo/Other  negative endocrine ROS  Renal/GU negative Renal ROS  negative genitourinary   Musculoskeletal negative musculoskeletal ROS (+)   Abdominal   Peds negative pediatric ROS (+)  Hematology negative hematology ROS (+)   Anesthesia Other Findings   Reproductive/Obstetrics negative OB ROS                            Lab Results  Component Value Date   WBC 8.0 04/15/2016   HGB 14.6 04/15/2016   HCT 45.6 04/15/2016   MCV 87.4 04/15/2016   PLT 177 04/15/2016   Lab Results  Component Value Date   CREATININE 1.02 04/15/2016   BUN 8 04/15/2016   NA 139 04/15/2016   K 3.7 04/15/2016   CL 104 04/15/2016   CO2 26 04/15/2016   No results found for: INR, PROTIME   Anesthesia Physical Anesthesia Plan  ASA: II and emergent  Anesthesia Plan: General   Post-op Pain Management:    Induction: Intravenous  Airway Management Planned:   Additional Equipment:   Intra-op Plan:   Post-operative Plan: Extubation in OR  Informed Consent: I have reviewed the patients History and Physical, chart, labs and discussed the procedure including the risks, benefits and alternatives for the proposed anesthesia with the patient or authorized representative who has indicated his/her understanding and acceptance.     Plan Discussed with: CRNA  Anesthesia Plan Comments:         Anesthesia Quick Evaluation

## 2016-04-16 ENCOUNTER — Encounter (HOSPITAL_COMMUNITY): Payer: Self-pay | Admitting: Orthopedic Surgery

## 2016-04-16 DIAGNOSIS — L03114 Cellulitis of left upper limb: Secondary | ICD-10-CM | POA: Diagnosis present

## 2016-04-16 DIAGNOSIS — M7989 Other specified soft tissue disorders: Secondary | ICD-10-CM | POA: Diagnosis present

## 2016-04-16 DIAGNOSIS — F17200 Nicotine dependence, unspecified, uncomplicated: Secondary | ICD-10-CM | POA: Diagnosis present

## 2016-04-16 LAB — CBC
HCT: 45.2 % (ref 39.0–52.0)
Hemoglobin: 13.5 g/dL (ref 13.0–17.0)
MCH: 27.2 pg (ref 26.0–34.0)
MCHC: 29.9 g/dL — ABNORMAL LOW (ref 30.0–36.0)
MCV: 91.1 fL (ref 78.0–100.0)
PLATELETS: 174 10*3/uL (ref 150–400)
RBC: 4.96 MIL/uL (ref 4.22–5.81)
RDW: 15.2 % (ref 11.5–15.5)
WBC: 8.8 10*3/uL (ref 4.0–10.5)

## 2016-04-16 MED ORDER — MORPHINE SULFATE (PF) 2 MG/ML IV SOLN
2.0000 mg | INTRAVENOUS | Status: DC | PRN
Start: 2016-04-16 — End: 2016-04-16
  Administered 2016-04-16: 2 mg via INTRAVENOUS
  Filled 2016-04-16: qty 1

## 2016-04-16 MED ORDER — HYDROMORPHONE HCL 1 MG/ML IJ SOLN
1.0000 mg | INTRAMUSCULAR | Status: DC | PRN
  Administered 2016-04-16 – 2016-04-18 (×11): 1 mg via INTRAVENOUS
  Filled 2016-04-16 (×12): qty 1

## 2016-04-16 NOTE — Progress Notes (Signed)
HOSPITALIST DAILY PROGRESS NOTE Hospital Day:   Patient Name: Derrick Rivers Admission Date/Time: 04/15/2016  4:51 PM  Age/Sex:35 y.o.male MRN#: [id number]  DOB: [birth year] Attending Provider: Janann August, MD     PCP/Outpatient Specialists: No care team member to display     Brief Narrative:  Derrick Rivers is a 35 y.o. male with medical history significant for skin and soft-tissue infections involving the bilateral upper extremities s/p fasciotomy to LUE who presents to the ED with two days of swelling, redness, and pain to the left dorsal hand and forearm.  He was started on Keflex and Bactrim, but the swelling and erythema have continued to progress. A dose of vancomycin was administered empirically and symptomatic care was provided with IV morphine. Hand surgery was consulted and the patient was taken back to the OR for I&D. Admission to the medicine service for continued antibiotics was requested.    Assessment:   Active Hospital Problems   Diagnosis Date Noted  . Cellulitis of left upper extremity 04/15/2016    Resolved Hospital Problems   Diagnosis Date Noted Date Resolved  No resolved problems to display.     Plan:  - appreciate Dr. Merlyn Lot, ok to DC home from his standpoint, start hydrotherapy in 2-3 days (as outpatient) - Will await culture for better optimization of antibiotic, continue on unasyn and vanco for now - check MRSA screen - hopefully can also get pain better controlled, advised to use orals over IV meds as we will have to DC him on oral meds. Tried higher dose of morphine but liked dilaudid better.    I examined the patient and reviewed the chart, labs and data. I discussed the patient's status and plan of care with Patient and Family  DVT prophylaxis:  Lovenox 40 mg subq q24h  Code Status: Full Code Family Communication:   Family in room Disposition Plan:  Hopefully discharge tomorrow pending culture result and MRSA screen.    Consultants:    Orthopedics/hand surgery  Procedures:  04/15/2016 Incision and drainage of left hand and distal forearm through separate incisions.  Antimicrobials:  unasyn 5/23  Subjective:  Patient with a lot of pain in the left arm. Pain only moderately controlled with meds. States that IV med only lasts about 30 minutes.    Objective:  Temp:  [97.7 F (36.5 C)-98.6 F (37 C)] 98.6 F (37 C) (05/24 1250) Pulse Rate:  [67-87] 79 (05/24 1250) Resp:  [15-16] 16 (05/24 1250) BP: (111-175)/(64-108) 130/82 mmHg (05/24 1250) SpO2:  [94 %-100 %] 99 % (05/24 1250) SpO2 Readings from Last 3 Encounters:  04/16/16 99%    Intake/Output Summary (Last 24 hours) at 04/16/16 1725 Last data filed at 04/16/16 1704  Gross per 24 hour  Intake   2040 ml  Output      5 ml  Net   2035 ml   Filed Weights   04/15/16 1645  Weight: 111.131 kg (245 lb)   There is no height on file to calculate BMI.   Physical Exam  Constitutional: He is oriented to person, place, and time. No distress.  Cardiovascular: Normal rate, regular rhythm and normal heart sounds.   Pulmonary/Chest: Effort normal and breath sounds normal. No respiratory distress. He has no wheezes.  Abdominal: Soft. Bowel sounds are normal. He exhibits no distension. There is no tenderness.  Musculoskeletal: He exhibits no edema or tenderness.  Neurological: He is alert and oriented to person, place, and time. GCS score is 15.  Skin: Skin is  warm and dry. He is not diaphoretic.  Left arm dressed      Medications:  Scheduled Meds: . ampicillin-sulbactam (UNASYN) IV  3 g Intravenous Q8H  . enoxaparin (LOVENOX) injection  40 mg Subcutaneous Q24H  . vancomycin  1,000 mg Intravenous Q8H  . vitamin C  1,000 mg Oral Daily   Continuous Infusions: . lactated ringers     PRN Meds:  diphenhydrAMINE 25-50 mg Q6H PRN  hydrALAZINE 10 mg Q4H PRN  HYDROmorphone (DILAUDID) injection 1 mg Q4H PRN  methocarbamol 500 mg Q6H PRN  Or    methocarbamol  (ROBAXIN)  IV 500 mg Q6H PRN  ondansetron 4 mg Q6H PRN  Or    ondansetron (ZOFRAN) IV 4 mg Q6H PRN  oxyCODONE-acetaminophen 1-2 tablet Q4H PRN  temazepam 15 mg QHS PRN,MR X 1    Labs:  CBC:  Recent Labs Lab 04/15/16 1659 04/15/16 2240 04/16/16 0343  WBC 8.0 5.9 8.8  NEUTROABS 5.6  --   --   HGB 14.6 13.5 13.5  HCT 45.6 44.2 45.2  MCV 87.4 88.4 91.1  PLT 177 160 174     Basic Metabolic Panel:  Recent Labs Lab 04/15/16 1659 04/15/16 2240  GLUCOSE 99  --   NA 139  --   K 3.7  --   CL 104  --   CO2 26  --   BUN 8  --   CREATININE 1.02 0.98  CALCIUM 9.3  --     Recent Results (from the past 240 hour(s))  Aerobic/Anaerobic Culture(surg specimen) (NOT AT [facility])     Status: None (Preliminary result)   Collection Time: 04/15/16  9:18 PM  Result Value Ref Range Status   Specimen Description ABSCESS  Final   Special Requests LEFT HAND AND ARM  Final   Gram Stain   Final    RARE WBC PRESENT, PREDOMINANTLY MONONUCLEAR NO ORGANISMS SEEN Gram Stain Report Called to,Read Back By and Verified With: DR.[email or web address],MD @0124  04/16/16 MKELLY    Culture PENDING  Incomplete   Report Status PENDING  Incomplete     Radiology Studies: Dg Forearm Left  04/15/2016  CLINICAL DATA:  Swelling in the forearm. EXAM: LEFT FOREARM - 2 VIEW COMPARISON:  None. FINDINGS: The abnormal swelling in the dorsum of the hand and wrist continues along the dorsum and radial side of the forearm, where there is infiltration of the subcutaneous tissues along with soft tissue thickening. No foreign body observed. No underlying bony abnormality. IMPRESSION: 1. Abnormal edema tracking in the dorsum and radial side of the forearm. Cellulitis is not excluded. No gas in the soft tissues noted. Electronically Signed   By: Gaylyn Rong M.D.   On: 04/15/2016 18:15   Dg Hand Complete Left  04/15/2016  CLINICAL DATA:  Swelling and pain in the left hand beginning today. Tightness extending up to the elbow. Swelling.  History of cellulitis. EXAM: LEFT HAND - COMPLETE 3+ VIEW COMPARISON:  None. FINDINGS: The patient flexed his fingers during imaging, reducing diagnostic sensitivity and specificity in assessing the fingers. I do not observe a foreign body. No bony destructive findings or fracture observed. There is extensive soft tissue swelling along the dorsum of the hand without gas tracking in the soft tissues. This tracks into the dorsum of the wrist as well. Lateral sclerosis in the distal phalanx of the thumb, likely incidental. IMPRESSION: 1. Prominent dorsal soft tissue swelling in the hand and wrist, without underlying bony abnormality or visible foreign body. Electronically Signed  By: Gaylyn RongWalter  Liebkemann M.D.   On: 04/15/2016 18:14    Time spent: 25 minutes  Janann AugustLerissa A Yeray Tomas, MD [facility]  If 7PM-7AM, please contact night-coverage [email or web address] Password [email or web address] 04/16/2016, 5:25 PM

## 2016-04-17 LAB — CBC
HEMATOCRIT: 42.9 % (ref 39.0–52.0)
HEMOGLOBIN: 13.2 g/dL (ref 13.0–17.0)
MCH: 27.2 pg (ref 26.0–34.0)
MCHC: 30.8 g/dL (ref 30.0–36.0)
MCV: 88.3 fL (ref 78.0–100.0)
Platelets: 158 10*3/uL (ref 150–400)
RBC: 4.86 MIL/uL (ref 4.22–5.81)
RDW: 14.8 % (ref 11.5–15.5)
WBC: 7.2 10*3/uL (ref 4.0–10.5)

## 2016-04-17 LAB — VANCOMYCIN, TROUGH: Vancomycin Tr: 9 ug/mL — ABNORMAL LOW (ref 10.0–20.0)

## 2016-04-17 LAB — MRSA PCR SCREENING: MRSA by PCR: NEGATIVE

## 2016-04-17 MED ORDER — AMOXICILLIN-POT CLAVULANATE 875-125 MG PO TABS
1.0000 | ORAL_TABLET | Freq: Two times a day (BID) | ORAL | Status: DC
  Administered 2016-04-17 – 2016-04-21 (×8): 1 via ORAL
  Filled 2016-04-17 (×7): qty 1

## 2016-04-17 MED ORDER — HYDROMORPHONE HCL 1 MG/ML IJ SOLN
1.0000 mg | Freq: Every day | INTRAMUSCULAR | Status: DC | PRN
  Administered 2016-04-17 – 2016-04-21 (×5): 1 mg via INTRAVENOUS
  Filled 2016-04-17 (×4): qty 1

## 2016-04-17 MED ORDER — MORPHINE SULFATE ER 15 MG PO TBCR
15.0000 mg | EXTENDED_RELEASE_TABLET | Freq: Two times a day (BID) | ORAL | Status: DC
  Administered 2016-04-17 – 2016-04-21 (×9): 15 mg via ORAL
  Filled 2016-04-17 (×9): qty 1

## 2016-04-17 NOTE — Progress Notes (Signed)
Pharmacy Antibiotic Note  Derrick Rivers is a [age] y.o. male admitted on 04/15/2016 with left hand infection.  Pharmacy was  consulted for Vancomycin and Unasyn dosing for cellulitis of LUE.  Pt is s/p I&D on 04/15/16.  WBC remains wnl, Afebrile SCr 0.98 on 5.23, estimated CrCl > 100 mL/min.  Vancomycin trough at steady state  = 9 (drawn 1 hour late), estimate true Trough at least 10 mcg/ml on 1gm q8h   (goal VT =10-15)   Plan: Continue Vancomycin 1 gm IV Q 8 hours Continue Unasyn 3 gm IV Q 8 hours Monitor CBC, renal fx, cultures and clinical progress   Weight: 245 lb (111.131 kg)  Temp (24hrs), Avg:98 F (36.7 C), Min:97.9 F (36.6 C), Max:98.1 F (36.7 C)   Recent Labs Lab 04/15/16 1659 04/15/16 2240 04/16/16 0343 04/17/16 0548 04/17/16 1027  WBC 8.0 5.9 8.8 7.2  --   CREATININE 1.02 0.98  --   --   --   VANCOTROUGH  --   --   --   --  9*    CrCl cannot be calculated (Unknown ideal weight.).    No Known Allergies  Antimicrobials this admission:  Vancomycin trough  5/23 Vancomycin>> 5/23 Unasyn>>    Dose adjustments this admission: None   Microbiology results: 5/23 Abscess left hand>>ngtd  5/24 MRSA PCR negative  Thank you for allowing pharmacy to be a part of this patient's care.  Noah Delaineuth Persephone Schriever, RPh Clinical Pharmacist Pager: [phone] 04/17/2016 1:47 PM

## 2016-04-17 NOTE — Consult Note (Addendum)
WOC consult requested for arm wound by primary team.  The hand surgery team is following for assessment and plan of care and has performed surgery.  Please refer to their team for further questions. Please re-consult if further assistance is needed.  Thank-you,  Cammie Mcgeeawn [name] MSN, RN, CWOCN, BaxterWCN-AP, CNS [phone]

## 2016-04-17 NOTE — Progress Notes (Signed)
HOSPITALIST DAILY PROGRESS NOTE Hospital Day: 1  Patient Name: Derrick Rivers Admission Date/Time: 04/15/2016  4:51 PM  Age/Sex:35 y.o.male MRN#: [id number]  DOB: 1980-12-30 Attending Provider: Janann AugustLerissa A Mikea Quadros, MD   LOS: 1 day   PCP/Outpatient Specialists: No care team member to display     Brief Narrative:  Derrick SpanJoseph Kibby is a 35 y.o. male with medical history significant for skin and soft-tissue infections involving the bilateral upper extremities s/p fasciotomy to LUE who presents to the ED with two days of swelling, redness, and pain to the left dorsal hand and forearm. He was started on Keflex and Bactrim, but the swelling and erythema have continued to progress. A dose of vancomycin was administered empirically and symptomatic care was provided with IV morphine. Hand surgery was consulted and the patient was taken back to the OR for I&D 5/23. Admission to the medicine service for continued antibiotics was requested.hydrotherapy ordered 5/25    Assessment:   Active Hospital Problems   Diagnosis Date Noted  . Cellulitis of left upper extremity 04/15/2016    Resolved Hospital Problems   Diagnosis Date Noted Date Resolved  No resolved problems to display.     Plan:  - appreciate Dr. Merlyn LotKuzma, recs were to start hydrotherapy in 2-3 days - which is today 5/25 - Will await culture for better optimization of antibiotic, MRSA screen was negative, DC vanco. - I'll change Unasyn to PO augmentin, no SIRS.  - patient tells me that his pain medication tolerance is very high and in the past he's required pain specialist to see him. I will therefore start MS contin, with hopes that he decreases use of his PRN dilaudid and PRN percocet. He agrees with plan of care. Can get IV dilaudid prior to hydro.  - labs all stable  I examined the patient and reviewed the chart, labs and data. I discussed the patient's status and plan of care with Patient  DVT prophylaxis:  Lovenox 40 mg subq q24h  Code  Status: Full Code Family Communication:   none Disposition Plan:  DC once wound appears improved with hydrotherapy.    Consultants:   Orthopedics/hand surgery  Procedures:  04/15/2016 Incision and drainage of left hand and distal forearm through separate incisions.  Antimicrobials:  unasyn 5/23-5/25  Vancomycin 5/23-5/25  augmentin 5/25  Subjective:  Patient states that pain is still uncontrolled of left arm. Reports that his pain medication tolerance is very high. No other issues.    Objective:  Temp:  [97.9 F (36.6 C)-98.2 F (36.8 C)] 98.2 F (36.8 C) (05/25 1300) Pulse Rate:  [64-88] 64 (05/25 1300) Resp:  [16-17] 16 (05/25 1300) BP: (121-155)/(62-76) 155/62 mmHg (05/25 1300) SpO2:  [95 %-99 %] 95 % (05/25 1300) SpO2 Readings from Last 3 Encounters:  04/17/16 95%    Intake/Output Summary (Last 24 hours) at 04/17/16 1416 Last data filed at 04/17/16 0900  Gross per 24 hour  Intake    800 ml  Output      0 ml  Net    800 ml   Filed Weights   04/15/16 1645  Weight: 111.131 kg (245 lb)   There is no height on file to calculate BMI.   Physical Exam  Constitutional: He is oriented to person, place, and time and well-developed, well-nourished, and in no distress. No distress.  Cardiovascular: Normal rate, regular rhythm and normal heart sounds.   Pulmonary/Chest: Effort normal and breath sounds normal. No respiratory distress. He has no wheezes.  Abdominal: Soft. Bowel  sounds are normal. He exhibits no distension. There is no tenderness.  Musculoskeletal: He exhibits no edema or tenderness.  Neurological: He is alert and oriented to person, place, and time. GCS score is 15.  Skin: Skin is warm and dry. He is not diaphoretic. No erythema.  Left arm dressed      Medications:  Scheduled Meds: . ampicillin-sulbactam (UNASYN) IV  3 g Intravenous Q8H  . enoxaparin (LOVENOX) injection  40 mg Subcutaneous Q24H  . morphine  15 mg Oral Q12H  . vancomycin  1,000  mg Intravenous Q8H  . vitamin C  1,000 mg Oral Daily   Continuous Infusions: . lactated ringers     PRN Meds:  diphenhydrAMINE 25-50 mg Q6H PRN  hydrALAZINE 10 mg Q4H PRN  HYDROmorphone (DILAUDID) injection 1 mg Q4H PRN  HYDROmorphone (DILAUDID) injection 1 mg Daily PRN  methocarbamol 500 mg Q6H PRN  Or    methocarbamol (ROBAXIN)  IV 500 mg Q6H PRN  ondansetron 4 mg Q6H PRN  Or    ondansetron (ZOFRAN) IV 4 mg Q6H PRN  oxyCODONE-acetaminophen 1-2 tablet Q4H PRN  temazepam 15 mg QHS PRN,MR X 1    Labs:  CBC:  Recent Labs Lab 04/15/16 1659 04/15/16 2240 04/16/16 0343 04/17/16 0548  WBC 8.0 5.9 8.8 7.2  NEUTROABS 5.6  --   --   --   HGB 14.6 13.5 13.5 13.2  HCT 45.6 44.2 45.2 42.9  MCV 87.4 88.4 91.1 88.3  PLT 177 160 174 158    CBG: No results for input(s): GLUCAP in the last 168 hours.  Basic Metabolic Panel:  Recent Labs Lab 04/15/16 1659 04/15/16 2240  GLUCOSE 99  --   NA 139  --   K 3.7  --   CL 104  --   CO2 26  --   BUN 8  --   CREATININE 1.02 0.98  CALCIUM 9.3  --     Recent Results (from the past 240 hour(s))  Aerobic/Anaerobic Culture(surg specimen) (NOT AT [facility])     Status: None (Preliminary result)   Collection Time: 04/15/16  9:18 PM  Result Value Ref Range Status   Specimen Description ABSCESS  Final   Special Requests LEFT HAND AND ARM  Final   Gram Stain   Final    RARE WBC PRESENT, PREDOMINANTLY MONONUCLEAR NO ORGANISMS SEEN Gram Stain Report Called to,Read Back By and Verified With: DR.KUZMA,MD  04/16/16 MKELLY    Culture NO GROWTH 1 DAY  Final   Report Status PENDING  Incomplete  MRSA PCR Screening     Status: None   Collection Time: 04/16/16  9:59 PM  Result Value Ref Range Status   MRSA by PCR NEGATIVE NEGATIVE Final    Comment:        The GeneXpert MRSA Assay (FDA approved for NASAL specimens only), is one component of a comprehensive MRSA colonization surveillance program. It is not intended to diagnose  MRSA infection nor to guide or monitor treatment for MRSA infections.      Radiology Studies: Dg Forearm Left  04/15/2016  CLINICAL DATA:  Swelling in the forearm. EXAM: LEFT FOREARM - 2 VIEW COMPARISON:  None. FINDINGS: The abnormal swelling in the dorsum of the hand and wrist continues along the dorsum and radial side of the forearm, where there is infiltration of the subcutaneous tissues along with soft tissue thickening. No foreign body observed. No underlying bony abnormality. IMPRESSION: 1. Abnormal edema tracking in the dorsum and radial side of  the forearm. Cellulitis is not excluded. No gas in the soft tissues noted. Electronically Signed   By: Gaylyn Rong M.D.   On: 04/15/2016 18:15   Dg Hand Complete Left  04/15/2016  CLINICAL DATA:  Swelling and pain in the left hand beginning today. Tightness extending up to the elbow. Swelling. History of cellulitis. EXAM: LEFT HAND - COMPLETE 3+ VIEW COMPARISON:  None. FINDINGS: The patient flexed his fingers during imaging, reducing diagnostic sensitivity and specificity in assessing the fingers. I do not observe a foreign body. No bony destructive findings or fracture observed. There is extensive soft tissue swelling along the dorsum of the hand without gas tracking in the soft tissues. This tracks into the dorsum of the wrist as well. Lateral sclerosis in the distal phalanx of the thumb, likely incidental. IMPRESSION: 1. Prominent dorsal soft tissue swelling in the hand and wrist, without underlying bony abnormality or visible foreign body. Electronically Signed   By: Gaylyn Rong M.D.   On: 04/15/2016 18:14    Time spent: 25 minutes  Janann August, MD [facility]  If 7PM-7AM, please contact night-coverage [email or web address] Password [email or web address] 04/17/2016, 2:16 PM

## 2016-04-17 NOTE — Progress Notes (Signed)
Physical Therapy Wound Eval/Treatment Patient Details  Name: Derrick Rivers MRN: [id number] Date of Birth: 1981-04-28  Today's Date: 04/17/2016 Time: 1015-1130 Time Calculation (min): 75 min  Subjective  Subjective: Pt agreeable to hydrotherapy - very painful Patient and Family Stated Goals: Pain control and wound healing Date of Onset: 04/13/16 Prior Treatments: Medication, I&D  Pain Score: Pt in increased pain and requested more pain medication. RN paged MD and received IV pain meds during session which made treatment manageable for pt.  Wound Assessment  Wound / Incision (Open or Dehisced) 04/17/16 Incision - Open Arm Left Proximal (Active)  Dressing Type ABD;Impregnated gauze (bismuth);Compression wrap;Gauze (Comment) 04/17/2016  2:52 PM  Dressing Changed Changed 04/17/2016  2:52 PM  Dressing Status Clean;Dry;Intact 04/17/2016  2:52 PM  Dressing Change Frequency Daily 04/17/2016  2:52 PM  Site / Wound Assessment Red 04/17/2016  2:52 PM  % Wound base Red or Granulating 100% 04/17/2016  2:52 PM  % Wound base Yellow 0% 04/17/2016  2:52 PM  % Wound base Black 0% 04/17/2016  2:52 PM  Peri-wound Assessment Intact;Induration 04/17/2016  2:52 PM  Wound Length (cm) 5 cm 04/17/2016  2:52 PM  Wound Width (cm) 2 cm 04/17/2016  2:52 PM  Wound Depth (cm) 0.4 cm 04/17/2016  2:52 PM  Undermining (cm) 1.4 at 3 o'clock, 2.5 at 1 o'clock 04/17/2016  2:52 PM  Margins Unattached edges (unapproximated) 04/17/2016  2:52 PM  Closure None 04/17/2016  2:52 PM  Drainage Amount Copious 04/17/2016  2:52 PM  Drainage Description Other (Comment) 04/17/2016  2:52 PM  Treatment Hydrotherapy (Pulse lavage);Packing (Impregnated strip) 04/17/2016  2:52 PM     Wound / Incision (Open or Dehisced) 04/17/16 Incision - Open Hand Left (Active)  Dressing Type ABD;Gauze (Comment);Impregnated gauze (bismuth);Compression wrap 04/17/2016  2:52 PM  Dressing Changed Changed 04/17/2016  2:52 PM  Dressing Status Clean;Dry;Intact 04/17/2016  2:52  PM  Dressing Change Frequency Daily 04/17/2016  2:52 PM  Site / Wound Assessment Red;Yellow;Brown 04/17/2016  2:52 PM  % Wound base Red or Granulating 75% 04/17/2016  2:52 PM  % Wound base Yellow 20% 04/17/2016  2:52 PM  % Wound base Black 5% 04/17/2016  2:52 PM  % Wound base Other (Comment) 0% 04/17/2016  2:52 PM  Peri-wound Assessment Intact;Erythema (blanchable);Induration;Pink 04/17/2016  2:52 PM  Wound Length (cm) 6 cm 04/17/2016  2:52 PM  Wound Width (cm) 2 cm 04/17/2016  2:52 PM  Wound Depth (cm) 1.7 cm 04/17/2016  2:52 PM  Undermining (cm) 1.5 from 3-5 o'clock, .9 from 6-8 o'clock, .5 from 9-12 o'clock 04/17/2016  2:52 PM  Margins Unattached edges (unapproximated) 04/17/2016  2:52 PM  Closure None 04/17/2016  2:52 PM  Drainage Amount Moderate 04/17/2016  2:52 PM  Drainage Description Sanguineous 04/17/2016  2:52 PM  Treatment Hydrotherapy (Pulse lavage);Packing (Impregnated strip) 04/17/2016  2:52 PM   Hydrotherapy Pulsed lavage therapy - wound location: L hand and forearm Pulsed Lavage with Suction (psi): 8 psi (4 at times due to pain) Pulsed Lavage with Suction - Normal Saline Used: 1000 mL (between the 2 wounds) Pulsed Lavage Tip: Tip with splash shield   Wound Assessment and Plan  Wound Therapy - Assess/Plan/Recommendations Wound Therapy - Clinical Statement: Pt presents to hydrotherapy s/p I&D on 04/15/16. Copious amounts of blood noted upon removal of surgical bandages and packing strips, especially on L forearm. Bleeding subsided after pulsed-lavage. Pt will benefit from continued hydrotherapy to promote wound bed healing and decrease bioburden. Recommend additional days for hydrotherapy before d/c.  Wound Therapy - Functional Problem List: Decreased strength and AROM of LUE, and acute pain.  Factors Delaying/Impairing Wound Healing: Infection - systemic/local Hydrotherapy Plan: Debridement;Dressing change;Patient/family education;Pulsatile lavage with suction Wound Therapy - Frequency:  6X / week Wound Therapy - Follow Up Recommendations: Other (comment) (Pt will need continued dressing changes via RN at d/c.) Wound Plan: See above  Wound Therapy Goals- Improve the function of patient's integumentary system by progressing the wound(s) through the phases of wound healing (inflammation - proliferation - remodeling) by: Decrease Necrotic Tissue to: 0% Decrease Necrotic Tissue - Progress: Goal set today Increase Granulation Tissue to: 100% Goals/treatment plan/discharge plan were made with and agreed upon by patient/family: Yes Time For Goal Achievement: 7 days Wound Therapy - Potential for Goals: Excellent  Goals will be updated until maximal potential achieved or discharge criteria met.  Discharge criteria: when goals achieved, discharge from hospital, MD decision/surgical intervention, no progress towards goals, refusal/missing three consecutive treatments without notification or medical reason.  GP     Rolinda Roan 04/17/2016, 3:17 PM  Rolinda Roan, PT, DPT Acute Rehabilitation Services Pager: [phone]

## 2016-04-18 DIAGNOSIS — L03114 Cellulitis of left upper limb: Principal | ICD-10-CM

## 2016-04-18 LAB — COMPREHENSIVE METABOLIC PANEL
ALT: 19 U/L (ref 17–63)
AST: 20 U/L (ref 15–41)
Albumin: 3.7 g/dL (ref 3.5–5.0)
Alkaline Phosphatase: 73 U/L (ref 38–126)
Anion gap: 9 (ref 5–15)
BUN: 6 mg/dL (ref 6–20)
CHLORIDE: 101 mmol/L (ref 101–111)
CO2: 29 mmol/L (ref 22–32)
CREATININE: 0.9 mg/dL (ref 0.61–1.24)
Calcium: 9.2 mg/dL (ref 8.9–10.3)
GFR calc Af Amer: >60 mL/min (ref >60)
Glucose, Bld: 93 mg/dL (ref 65–99)
POTASSIUM: 3.8 mmol/L (ref 3.5–5.1)
SODIUM: 139 mmol/L (ref 135–145)
Total Bilirubin: 0.5 mg/dL (ref 0.3–1.2)
Total Protein: 6 g/dL — ABNORMAL LOW (ref 6.5–8.1)

## 2016-04-18 LAB — CBC
HCT: 42.7 % (ref 39.0–52.0)
Hemoglobin: 13.2 g/dL (ref 13.0–17.0)
MCH: 27.6 pg (ref 26.0–34.0)
MCHC: 30.9 g/dL (ref 30.0–36.0)
MCV: 89.3 fL (ref 78.0–100.0)
PLATELETS: 167 10*3/uL (ref 150–400)
RBC: 4.78 MIL/uL (ref 4.22–5.81)
RDW: 14.8 % (ref 11.5–15.5)
WBC: 5.7 10*3/uL (ref 4.0–10.5)

## 2016-04-18 MED ORDER — HYDROMORPHONE HCL 1 MG/ML IJ SOLN
2.0000 mg | INTRAMUSCULAR | Status: DC | PRN
  Administered 2016-04-18 – 2016-04-21 (×19): 2 mg via INTRAVENOUS
  Filled 2016-04-18 (×19): qty 2

## 2016-04-18 NOTE — Progress Notes (Signed)
Physical Therapy Wound Treatment Patient Details  Name: Derrick Rivers MRN: [id number] Date of Birth: 01-10-1981  Today's Date: 04/18/2016 Time: 1100-1126 Time Calculation (min): 26 min  Subjective  Subjective: Pt agreeable to hydrotherapy. Patient and Family Stated Goals: Pain control and wound healing Date of Onset: 04/13/16 Prior Treatments: Medication, I&D  Pain Score: Pt premedicated prior to treatment  Wound Assessment  Wound / Incision (Open or Dehisced) 04/17/16 Incision - Open Arm Left Proximal (Active)  Dressing Type ABD;Compression wrap;Gauze (Comment);Moist to dry 04/18/2016  1:25 PM  Dressing Changed Changed 04/18/2016  1:25 PM  Dressing Status Clean;Dry;Intact 04/18/2016  1:25 PM  Dressing Change Frequency Daily 04/18/2016  1:25 PM  Site / Wound Assessment Red 04/18/2016  1:25 PM  % Wound base Red or Granulating 100% 04/18/2016  1:25 PM  % Wound base Yellow 0% 04/18/2016  1:25 PM  % Wound base Black 0% 04/18/2016  1:25 PM  Peri-wound Assessment Intact;Induration 04/18/2016  1:25 PM  Wound Length (cm) 5 cm 04/17/2016  2:52 PM  Wound Width (cm) 2 cm 04/17/2016  2:52 PM  Wound Depth (cm) 0.4 cm 04/17/2016  2:52 PM  Undermining (cm) 1.4 at 3 o'clock, 2.5 at 1 o'clock 04/17/2016  2:52 PM  Margins Unattached edges (unapproximated) 04/18/2016  1:25 PM  Closure None 04/18/2016  1:25 PM  Drainage Amount Moderate 04/18/2016  1:25 PM  Drainage Description Other (Comment) 04/18/2016  1:25 PM  Treatment Hydrotherapy (Pulse lavage);Packing (Impregnated strip) 04/18/2016  1:25 PM     Wound / Incision (Open or Dehisced) 04/17/16 Incision - Open Hand Left (Active)  Dressing Type ABD;Gauze (Comment);Compression wrap;Moist to dry 04/18/2016  1:25 PM  Dressing Changed Changed 04/18/2016  1:25 PM  Dressing Status Clean;Dry;Intact 04/18/2016  1:25 PM  Dressing Change Frequency Daily 04/18/2016  1:25 PM  Site / Wound Assessment Red;Yellow 04/18/2016  1:25 PM  % Wound base Red or Granulating 80% 04/18/2016   1:25 PM  % Wound base Yellow 20% 04/18/2016  1:25 PM  % Wound base Black 0% 04/18/2016  1:25 PM  % Wound base Other (Comment) 0% 04/18/2016  1:25 PM  Peri-wound Assessment Intact;Erythema (blanchable);Induration;Pink 04/18/2016  1:25 PM  Wound Length (cm) 6 cm 04/17/2016  2:52 PM  Wound Width (cm) 2 cm 04/17/2016  2:52 PM  Wound Depth (cm) 1.7 cm 04/17/2016  2:52 PM  Undermining (cm) 1.5 from 3-5 o'clock, .9 from 6-8 o'clock, .5 from 9-12 o'clock 04/17/2016  2:52 PM  Margins Unattached edges (unapproximated) 04/18/2016  1:25 PM  Closure None 04/18/2016  1:25 PM  Drainage Amount Moderate 04/18/2016  1:25 PM  Drainage Description Sanguineous 04/18/2016  1:25 PM  Treatment Hydrotherapy (Pulse lavage);Packing (Impregnated strip) 04/18/2016  1:25 PM   Hydrotherapy Pulsed lavage therapy - wound location: L hand and forearm Pulsed Lavage with Suction (psi): 8 psi (4 at times due to pain) Pulsed Lavage with Suction - Normal Saline Used: 1000 mL (between the 2 wounds) Pulsed Lavage Tip: Tip with splash shield   Wound Assessment and Plan  Wound Therapy - Assess/Plan/Recommendations Wound Therapy - Clinical Statement: Pt will benefit from continued hydrotherapy to promote wound bed healing and decrease bioburden. Recommend additional days for hydrotherapy before d/c.  Wound Therapy - Functional Problem List: Decreased strength and AROM of LUE, and acute pain.  Factors Delaying/Impairing Wound Healing: Infection - systemic/local Hydrotherapy Plan: Debridement;Dressing change;Patient/family education;Pulsatile lavage with suction Wound Therapy - Frequency: 6X / week Wound Therapy - Follow Up Recommendations: Other (comment) (Pt will need continued dressing  changes via RN at d/c.) Wound Plan: See above  Wound Therapy Goals- Improve the function of patient's integumentary system by progressing the wound(s) through the phases of wound healing (inflammation - proliferation - remodeling) by: Decrease Necrotic  Tissue to: 0% Decrease Necrotic Tissue - Progress: Progressing toward goal Increase Granulation Tissue to: 100% Increase Granulation Tissue - Progress: Progressing toward goal Goals/treatment plan/discharge plan were made with and agreed upon by patient/family: Yes Time For Goal Achievement: 7 days Wound Therapy - Potential for Goals: Excellent  Goals will be updated until maximal potential achieved or discharge criteria met.  Discharge criteria: when goals achieved, discharge from hospital, MD decision/surgical intervention, no progress towards goals, refusal/missing three consecutive treatments without notification or medical reason.  GP     Rolinda Roan 04/18/2016, 1:28 PM   Rolinda Roan, PT, DPT Acute Rehabilitation Services Pager: [phone]

## 2016-04-18 NOTE — Progress Notes (Signed)
Subjective: 3 Days Post-Op Procedure(s) (LRB): IRRIGATION AND DEBRIDEMENT LEFT HAND AND  FOREARM (Left) Patient reports pain as controlled with oral pain meds.    Objective: Vital signs in last 24 hours: Temp:  [97.8 F (36.6 C)-97.9 F (36.6 C)] 97.8 F (36.6 C) (05/26 1500) Pulse Rate:  [69-76] 76 (05/26 1500) Resp:  [16-18] 18 (05/26 1500) BP: (136-149)/(77-78) 136/78 mmHg (05/26 1500) SpO2:  [94 %-98 %] 98 % (05/26 1500)  Intake/Output from previous day: 05/25 0701 - 05/26 0700 In: 240 [P.O.:240] Out: -  Intake/Output this shift:     Recent Labs  04/15/16 2240 04/16/16 0343 04/17/16 0548 04/18/16 0550  HGB 13.5 13.5 13.2 13.2    Recent Labs  04/17/16 0548 04/18/16 0550  WBC 7.2 5.7  RBC 4.86 4.78  HCT 42.9 42.7  PLT 158 167    Recent Labs  04/15/16 2240 04/18/16 0550  NA  --  139  K  --  3.8  CL  --  101  CO2  --  29  BUN  --  6  CREATININE 0.98 0.90  GLUCOSE  --  93  CALCIUM  --  9.2   No results for input(s): LABPT, INR in the last 72 hours.  intact sensation and capillary refill all digits.  moving digits without pain.  no proximal streaking.  dressing c/d/i.  Assessment/Plan: 3 Days Post-Op Procedure(s) (LRB): IRRIGATION AND DEBRIDEMENT LEFT HAND AND  FOREARM (Left) Patient states he has been told plan is for one additional therapy then discharge.  Stable for d/c from hand standpoint.  Oral antibiotics.  Follow up in office after d/c.  Jannelly Bergren R 04/18/2016, 6:58 PM

## 2016-04-18 NOTE — Progress Notes (Signed)
Patient ID: Derrick Rivers, male   DOB: 1981/02/17, 35 y.o.   MRN: [id number]  PROGRESS NOTE    Derrick Rivers  ZHY:[id number] DOB: 04/08/1981 DOA: 04/15/2016  PCP: No primary care provider on file.   Brief Narrative:  35 y.o. male with medical history significant for skin and soft-tissue infections involving the bilateral upper extremities s/p fasciotomy to LUE who presents to ED with two days of swelling, redness, and pain of the left dorsal hand and forearm. He was started on Keflex and Bactrim but the swelling and erythema have continued to progress. A dose of vancomycin was administered empirically and symptomatic care was provided with IV morphine. Hand surgery was consulted and the patient was taken to OR for I&D 5/23.  Assessment & Plan:  Principal Problem:   Cellulitis of left upper extremity - Status post incision and debridement 04/15/2016 by orthopedic surgery - Dressing changes per orthopedic surgery - Initially on Unasyn, vancomycin but as of 04/17/2016 patient on Augmentin - Continue pain management efforts - Discharge plan per orthopedic surgery    DVT prophylaxis: Lovenox subcutaneous Code Status: full code  Family Communication: Family not at the bedside, patient in police custody Disposition Plan: Home once cleared by orthopedic surgery   Consultants:   Orthopedics/hand surgery  Procedures:   04/15/2016 Incision and drainage of left hand and distal forearm through separate incisions.  Antimicrobials:   Unasyn 5/23-5/25  Vancomycin 5/23-5/25  Augmentin 5/25    Subjective: Patient reports severe pain not relieved with current analgesia.  Objective: Filed Vitals:   04/17/16 0430 04/17/16 1300 04/17/16 2130 04/18/16 0625  BP: 121/76 155/62 143/77 149/77  Pulse: 88 64 73 69  Temp: 98.1 F (36.7 C) 98.2 F (36.8 C) 97.9 F (36.6 C) 97.8 F (36.6 C)  TempSrc:   Oral Oral  Resp: Weight:      SpO2: 99% 95% 98% 94%    Intake/Output  Summary (Last 24 hours) at 04/18/16 0854 Last data filed at 04/17/16 0900  Gross per 24 hour  Intake    240 ml  Output      0 ml  Net    240 ml   Filed Weights   04/15/16 1645  Weight: 111.131 kg (245 lb)    Examination:  General exam: Appears calm and comfortable  Respiratory system: Clear to auscultation. Respiratory effort normal. Cardiovascular system: S1 & S2 heard, RRR. No JVD, murmurs, rubs, gallops or clicks. No pedal edema. Gastrointestinal system: Abdomen is nondistended, soft and nontender. No organomegaly or masses felt. Normal bowel sounds heard. Central nervous system: Alert and oriented. No focal neurological deficits. Extremities: Symmetric 5 x 5 power. Left hand and forearm wrapped Skin: No rashes, lesions or ulcers Psychiatry: Judgement and insight appear normal. Mood & affect appropriate.   Data Reviewed: I have personally reviewed following labs and imaging studies  CBC:  Recent Labs Lab 04/15/16 1659 04/15/16 2240 04/16/16 0343 04/17/16 0548 04/18/16 0550  WBC 8.0 5.9 8.8 7.2 5.7  NEUTROABS 5.6  --   --   --   --   HGB 14.6 13.5 13.5 13.2 13.2  HCT 45.6 44.2 45.2 42.9 42.7  MCV 87.4 88.4 91.1 88.3 89.3  PLT 177 160 174 158 167   Basic Metabolic Panel:  Recent Labs Lab 04/15/16 1659 04/15/16 2240  NA 139  --   K 3.7  --   CL 104  --   CO2 26  --   GLUCOSE 99  --  BUN 8  --   CREATININE 1.02 0.98  CALCIUM 9.3  --    GFR: CrCl cannot be calculated (Unknown ideal weight.). Liver Function Tests: No results for input(s): AST, ALT, ALKPHOS, BILITOT, PROT, ALBUMIN in the last 168 hours. No results for input(s): LIPASE, AMYLASE in the last 168 hours. No results for input(s): AMMONIA in the last 168 hours. Coagulation Profile: No results for input(s): INR, PROTIME in the last 168 hours. Cardiac Enzymes: No results for input(s): CKTOTAL, CKMB, CKMBINDEX, TROPONINI in the last 168 hours. BNP (last 3 results) No results for input(s): PROBNP  in the last 8760 hours. HbA1C: No results for input(s): HGBA1C in the last 72 hours. CBG: No results for input(s): GLUCAP in the last 168 hours. Lipid Profile: No results for input(s): CHOL, HDL, LDLCALC, TRIG, CHOLHDL, LDLDIRECT in the last 72 hours. Thyroid Function Tests: No results for input(s): TSH, T4TOTAL, FREET4, T3FREE, THYROIDAB in the last 72 hours. Anemia Panel: No results for input(s): VITAMINB12, FOLATE, FERRITIN, TIBC, IRON, RETICCTPCT in the last 72 hours. Urine analysis: No results found for: COLORURINE, APPEARANCEUR, LABSPEC, PHURINE, GLUCOSEU, HGBUR, BILIRUBINUR, KETONESUR, PROTEINUR, UROBILINOGEN, NITRITE, LEUKOCYTESUR Sepsis Labs: @LABRCNTIP (procalcitonin:4,lacticidven:4)  Aerobic/Anaerobic Culture(surg specimen) (NOT [facility])     Status: None (Preliminary result)   Collection Time: 04/15/16  9:18 PM  Result Value Ref Range Status   Specimen Description ABSCESS  Final   Special Requests LEFT HAND AND ARM  Final   Gram Stain   Final   Culture NO GROWTH 1 DAY  Final   Report Status PENDING  Incomplete  MRSA PCR Screening     Status: None   Collection Time: 04/16/16  9:59 PM  Result Value Ref Range Status   MRSA by PCR NEGATIVE NEGATIVE Final      Radiology Studies: Dg Forearm Left 04/15/2016  1. Abnormal edema tracking in the dorsum and radial side of the forearm. Cellulitis is not excluded. No gas in the soft tissues noted.  Dg Hand Complete Left 04/15/2016  1. Prominent dorsal soft tissue swelling in the hand and wrist, without underlying bony abnormality or visible foreign body.   Scheduled Meds: . amoxicillin-clavulanate  1 tablet Oral Q12H  . enoxaparin (LOVENOX) injection  40 mg Subcutaneous Q24H  . morphine  15 mg Oral Q12H  . vitamin C  1,000 mg Oral Daily   Continuous Infusions: . lactated ringers       LOS: 2 days    Time spent: 25 minutes  Greater than 50% of the time spent on counseling and coordinating the care.   Manson PasseyEVINE, Pattricia Weiher,  MD [facility] Pager [phone]  If 7PM-7AM, please contact night-coverage [email or web address] Password [email or web address] 04/18/2016, 8:54 AM

## 2016-04-19 NOTE — Progress Notes (Signed)
Physical Therapy Wound Treatment Patient Details  Name: Derrick Rivers MRN: [id number] Date of Birth: 06/14/1981  Today's Date: 04/19/2016 Time: 0100-7121 Time Calculation (min): 23 min  Subjective  Subjective: Pt agreeable to hydrotherapy. Patient and Family Stated Goals: Pain control and wound healing Date of Onset: 04/13/16 Prior Treatments: Medication, I&D  Pain Score: Pt was premedicated prior to treatment   Wound Assessment  Wound / Incision (Open or Dehisced) 04/17/16 Incision - Open Arm Left Proximal (Active)  Dressing Type ABD;Compression wrap;Gauze (Comment);Moist to dry 04/19/2016  8:47 AM  Dressing Changed Changed 04/19/2016  8:47 AM  Dressing Status Clean;Dry;Intact 04/19/2016  8:47 AM  Dressing Change Frequency Daily 04/19/2016  8:47 AM  Site / Wound Assessment Red 04/19/2016  8:47 AM  % Wound base Red or Granulating 100% 04/19/2016  8:47 AM  % Wound base Yellow 0% 04/19/2016  8:47 AM  % Wound base Black 0% 04/19/2016  8:47 AM  Peri-wound Assessment Intact;Induration 04/19/2016  8:47 AM  Wound Length (cm) 5 cm 04/17/2016  2:52 PM  Wound Width (cm) 2 cm 04/17/2016  2:52 PM  Wound Depth (cm) 0.4 cm 04/17/2016  2:52 PM  Undermining (cm) 1.4 at 3 o'clock, 2.5 at 1 o'clock 04/17/2016  2:52 PM  Margins Unattached edges (unapproximated) 04/19/2016  8:47 AM  Closure None 04/19/2016  8:47 AM  Drainage Amount Moderate 04/19/2016  8:47 AM  Drainage Description Other (Comment) 04/19/2016  8:47 AM  Treatment Hydrotherapy (Pulse lavage);Packing (Impregnated strip) 04/19/2016  8:47 AM     Wound / Incision (Open or Dehisced) 04/17/16 Incision - Open Hand Left (Active)  Dressing Type ABD;Gauze (Comment);Compression wrap;Moist to dry 04/19/2016  8:47 AM  Dressing Changed Changed 04/19/2016  8:47 AM  Dressing Status Clean;Dry;Intact 04/19/2016  8:47 AM  Dressing Change Frequency Daily 04/19/2016  8:47 AM  Site / Wound Assessment Red;Yellow 04/19/2016  8:47 AM  % Wound base Red or Granulating 80%  04/19/2016  8:47 AM  % Wound base Yellow 20% 04/19/2016  8:47 AM  % Wound base Black 0% 04/19/2016  8:47 AM  % Wound base Other (Comment) 0% 04/19/2016  8:47 AM  Peri-wound Assessment Intact;Erythema (blanchable);Induration;Pink 04/19/2016  8:47 AM  Wound Length (cm) 6 cm 04/17/2016  2:52 PM  Wound Width (cm) 2 cm 04/17/2016  2:52 PM  Wound Depth (cm) 1.7 cm 04/17/2016  2:52 PM  Undermining (cm) 1.5 from 3-5 o'clock, .9 from 6-8 o'clock, .5 from 9-12 o'clock 04/17/2016  2:52 PM  Margins Unattached edges (unapproximated) 04/19/2016  8:47 AM  Closure None 04/19/2016  8:47 AM  Drainage Amount Moderate 04/19/2016  8:47 AM  Drainage Description Sanguineous 04/19/2016  8:47 AM  Treatment Hydrotherapy (Pulse lavage);Packing (Impregnated strip) 04/19/2016  8:47 AM   Hydrotherapy Pulsed lavage therapy - wound location: L hand and forearm Pulsed Lavage with Suction (psi): 8 psi (4 at times due to pain) Pulsed Lavage with Suction - Normal Saline Used: 1000 mL (between the 2 wounds) Pulsed Lavage Tip: Tip with splash shield   Wound Assessment and Plan  Wound Therapy - Assess/Plan/Recommendations Wound Therapy - Clinical Statement: Pt will benefit from continued hydrotherapy to promote wound bed healing and decrease bioburden. Recommend additional days for hydrotherapy before d/c.  Wound Therapy - Functional Problem List: Decreased strength and AROM of LUE, and acute pain.  Factors Delaying/Impairing Wound Healing: Infection - systemic/local Hydrotherapy Plan: Debridement;Dressing change;Patient/family education;Pulsatile lavage with suction Wound Therapy - Frequency: 6X / week Wound Therapy - Follow Up Recommendations: Other (comment) (Pt will need  continued dressing changes via RN at d/c.) Wound Plan: See above  Wound Therapy Goals- Improve the function of patient's integumentary system by progressing the wound(s) through the phases of wound healing (inflammation - proliferation - remodeling) by: Decrease  Necrotic Tissue to: 0% Decrease Necrotic Tissue - Progress: Progressing toward goal Increase Granulation Tissue to: 100% Increase Granulation Tissue - Progress: Progressing toward goal Goals/treatment plan/discharge plan were made with and agreed upon by patient/family: Yes Time For Goal Achievement: 7 days Wound Therapy - Potential for Goals: Excellent  Goals will be updated until maximal potential achieved or discharge criteria met.  Discharge criteria: when goals achieved, discharge from hospital, MD decision/surgical intervention, no progress towards goals, refusal/missing three consecutive treatments without notification or medical reason.  GP     Rolinda Roan 04/19/2016, 9:23 AM   Rolinda Roan, PT, DPT Acute Rehabilitation Services Pager: [phone]

## 2016-04-19 NOTE — Progress Notes (Signed)
Patient ID: Derrick Rivers, male   DOB: [birth year], 35 y.o.   MRN: [id number]  PROGRESS NOTE    Caron Tardif  WUJ:[id number] DOB: 18-Aug-1981 DOA: 04/15/2016  PCP: No primary care provider on file.   Brief Narrative:  35 y.o. male with medical history significant for skin and soft-tissue infections involving the bilateral upper extremities s/p fasciotomy to LUE who presents to ED with two days of swelling, redness, and pain of the left dorsal hand and forearm. He was started on Keflex and Bactrim but the swelling and erythema have continued to progress. A dose of vancomycin was administered empirically and symptomatic care was provided with IV morphine. Hand surgery was consulted and the patient was taken to OR for I&D 5/23.  Assessment & Plan:  Principal Problem:   Cellulitis of left upper extremity - Status post incision and debridement 04/15/2016 by orthopedic surgery - Dressing changes per orthopedic surgery, hydrotherapy for an additional day - Initially on Unasyn, vancomycin but as of 04/17/2016 patient on Augmentin - Okay from hand surgery point for discharge on oral abx    DVT prophylaxis: Lovenox subcutaneous Code Status: full code  Family Communication: Family not at the bedside, patient in police custody Disposition Plan: Home 5/28   Consultants:   Orthopedics/hand surgery  Procedures:   04/15/2016 Incision and drainage of left hand and distal forearm through separate incisions.  Antimicrobials:   Unasyn 5/23-5/25  Vancomycin 5/23-5/25  Augmentin 5/25    Subjective: Said he did not sleep well.   Objective: Filed Vitals:   04/18/16 1500 04/18/16 2008 04/19/16 0407 04/19/16 0824  BP: 136/78 136/76 139/75 127/71  Pulse: 76 75 78 65  Temp: 97.8 F (36.6 C) 98.4 F (36.9 C) 98 F (36.7 C) 98 F (36.7 C)  TempSrc:  Oral Oral Oral  Resp: Weight:      SpO2: 98% 97% 97% 97%    Intake/Output Summary (Last 24 hours) at 04/19/16 1252 Last  data filed at 04/18/16 1700  Gross per 24 hour  Intake    480 ml  Output      0 ml  Net    480 ml   Filed Weights   04/15/16 1645  Weight: 111.131 kg (245 lb)    Examination:  General exam: Appears in no acute distress  Respiratory system: No wheezing, no rhonchi  Cardiovascular system: S1 & S2 (+), rate controlled  Gastrointestinal system: (+) BS, non tender abd Central nervous system: No focal neurological deficits. Extremities: Left hand and forearm wrapped Skin: warm and dry Psychiatry: No restlessness   Data Reviewed: I have personally reviewed following labs and imaging studies  CBC:  Recent Labs Lab 04/15/16 1659 04/15/16 2240 04/16/16 0343 04/17/16 0548 04/18/16 0550  WBC 8.0 5.9 8.8 7.2 5.7  NEUTROABS 5.6  --   --   --   --   HGB 14.6 13.5 13.5 13.2 13.2  HCT 45.6 44.2 45.2 42.9 42.7  MCV 87.4 88.4 91.1 88.3 89.3  PLT 177 160 174 158 167   Basic Metabolic Panel:  Recent Labs Lab 04/15/16 1659 04/15/16 2240 04/18/16 0550  NA 139  --  139  K 3.7  --  3.8  CL 104  --  101  CO2 26  --  29  GLUCOSE 99  --  93  BUN 8  --  6  CREATININE 1.02 0.98 0.90  CALCIUM 9.3  --  9.2   GFR: CrCl cannot be calculated (Unknown ideal  weight.). Liver Function Tests:  Recent Labs Lab 04/18/16 0550  AST 20  ALT 19  ALKPHOS 73  BILITOT 0.5  PROT 6.0*  ALBUMIN 3.7   No results for input(s): LIPASE, AMYLASE in the last 168 hours. No results for input(s): AMMONIA in the last 168 hours. Coagulation Profile: No results for input(s): INR, PROTIME in the last 168 hours. Cardiac Enzymes: No results for input(s): CKTOTAL, CKMB, CKMBINDEX, TROPONINI in the last 168 hours. BNP (last 3 results) No results for input(s): PROBNP in the last 8760 hours. HbA1C: No results for input(s): HGBA1C in the last 72 hours. CBG: No results for input(s): GLUCAP in the last 168 hours. Lipid Profile: No results for input(s): CHOL, HDL, LDLCALC, TRIG, CHOLHDL, LDLDIRECT in the  last 72 hours. Thyroid Function Tests: No results for input(s): TSH, T4TOTAL, FREET4, T3FREE, THYROIDAB in the last 72 hours. Anemia Panel: No results for input(s): VITAMINB12, FOLATE, FERRITIN, TIBC, IRON, RETICCTPCT in the last 72 hours. Urine analysis: No results found for: COLORURINE, APPEARANCEUR, LABSPEC, PHURINE, GLUCOSEU, HGBUR, BILIRUBINUR, KETONESUR, PROTEINUR, UROBILINOGEN, NITRITE, LEUKOCYTESUR Sepsis Labs: @LABRCNTIP (procalcitonin:4,lacticidven:4)  Aerobic/Anaerobic Culture(surg specimen) (NOT [facility])     Status: None (Preliminary result)   Collection Time: 04/15/16  9:18 PM  Result Value Ref Range Status   Specimen Description ABSCESS  Final   Special Requests LEFT HAND AND ARM  Final   Gram Stain   Final   Culture NO GROWTH 1 DAY  Final   Report Status PENDING  Incomplete  MRSA PCR Screening     Status: None   Collection Time: 04/16/16  9:59 PM  Result Value Ref Range Status   MRSA by PCR NEGATIVE NEGATIVE Final      Radiology Studies: Dg Forearm Left 04/15/2016  1. Abnormal edema tracking in the dorsum and radial side of the forearm. Cellulitis is not excluded. No gas in the soft tissues noted.  Dg Hand Complete Left 04/15/2016  1. Prominent dorsal soft tissue swelling in the hand and wrist, without underlying bony abnormality or visible foreign body.   Scheduled Meds: . amoxicillin-clavulanate  1 tablet Oral Q12H  . enoxaparin (LOVENOX) injection  40 mg Subcutaneous Q24H  . morphine  15 mg Oral Q12H  . vitamin C  1,000 mg Oral Daily   Continuous Infusions: . lactated ringers       LOS: 3 days    Time spent: 15 minutes  Greater than 50% of the time spent on counseling and coordinating the care.   Manson PasseyEVINE, Zoanne Newill, MD [facility] Pager [phone]  If 7PM-7AM, please contact night-coverage [email or web address] Password [email or web address] 04/19/2016, 12:52 PM

## 2016-04-20 NOTE — Progress Notes (Signed)
Patient ID: Derrick Rivers, male   DOB: [birth year], 35 y.o.   MRN: [id number]  PROGRESS NOTE    Keshav Winegar  NFA:[id number] DOB: 1981/10/13 DOA: 04/15/2016  PCP: No primary care provider on file.   Brief Narrative:  35 y.o. male with medical history significant for skin and soft-tissue infections involving the bilateral upper extremities s/p fasciotomy to LUE who presents to ED with two days of swelling, redness, and pain of the left dorsal hand and forearm. He was started on Keflex and Bactrim but the swelling and erythema have continued to progress. A dose of vancomycin was administered empirically and symptomatic care was provided with IV morphine. Hand surgery was consulted and the patient was taken to OR for I&D 5/23.  Assessment & Plan:  Principal Problem:   Cellulitis of left upper extremity - Status post incision and debridement 04/15/2016 by orthopedic surgery - Continue hydrotherapy for today and plan is for discharge tomorrow. He has been started on Augmentin 04/19/2016 with no reports of intolerance - Prior to that he was on vancomycin, Unasyn    DVT prophylaxis: Lovenox subcutaneous Code Status: full code  Family Communication: Family not at the bedside, patient in police custody Disposition Plan: Home 5/29   Consultants:   Orthopedics/hand surgery  Procedures:   04/15/2016 Incision and drainage of left hand and distal forearm through separate incisions.  Antimicrobials:   Unasyn 5/23-5/25  Vancomycin 5/23-5/25  Augmentin 5/25 -->   Subjective: Pain better this morning but still has issues during the wound care.   Objective: Filed Vitals:   04/19/16 0824 04/19/16 1417 04/19/16 2150 04/20/16 0710  BP: 127/71 138/67 108/60 118/78  Pulse: 65 66 69 77  Temp: 98 F (36.7 C) 97.5 F (36.4 C) 98.6 F (37 C) 98.3 F (36.8 C)  TempSrc: Oral Oral Oral   Resp: Weight:      SpO2: 97% 99% 96% 95%    Intake/Output Summary (Last 24 hours) at  04/20/16 0802 Last data filed at 04/19/16 1540  Gross per 24 hour  Intake    480 ml  Output      0 ml  Net    480 ml   Filed Weights   04/15/16 1645  Weight: 111.131 kg (245 lb)    Examination:  General exam: Appears calm and comfortable  Respiratory system: Bilateral air entry, no wheezing Cardiovascular system: S1 & S2 (+), RRR Gastrointestinal system: (+) BS, nontender, nondistended Central nervous system: Nonfocal Extremities: Left hand and forearm wrapped Psychiatry: Normal mood and behavior  Data Reviewed: I have personally reviewed following labs and imaging studies  CBC:  Recent Labs Lab 04/15/16 1659 04/15/16 2240 04/16/16 0343 04/17/16 0548 04/18/16 0550  WBC 8.0 5.9 8.8 7.2 5.7  NEUTROABS 5.6  --   --   --   --   HGB 14.6 13.5 13.5 13.2 13.2  HCT 45.6 44.2 45.2 42.9 42.7  MCV 87.4 88.4 91.1 88.3 89.3  PLT 177 160 174 158 167   Basic Metabolic Panel:  Recent Labs Lab 04/15/16 1659 04/15/16 2240 04/18/16 0550  NA 139  --  139  K 3.7  --  3.8  CL 104  --  101  CO2 26  --  29  GLUCOSE 99  --  93  BUN 8  --  6  CREATININE 1.02 0.98 0.90  CALCIUM 9.3  --  9.2   GFR: CrCl cannot be calculated (Unknown ideal weight.). Liver Function Tests:  Recent  Labs Lab 04/18/16 0550  AST 20  ALT 19  ALKPHOS 73  BILITOT 0.5  PROT 6.0*  ALBUMIN 3.7   No results for input(s): LIPASE, AMYLASE in the last 168 hours. No results for input(s): AMMONIA in the last 168 hours. Coagulation Profile: No results for input(s): INR, PROTIME in the last 168 hours. Cardiac Enzymes: No results for input(s): CKTOTAL, CKMB, CKMBINDEX, TROPONINI in the last 168 hours. BNP (last 3 results) No results for input(s): PROBNP in the last 8760 hours. HbA1C: No results for input(s): HGBA1C in the last 72 hours. CBG: No results for input(s): GLUCAP in the last 168 hours. Lipid Profile: No results for input(s): CHOL, HDL, LDLCALC, TRIG, CHOLHDL, LDLDIRECT in the last 72  hours. Thyroid Function Tests: No results for input(s): TSH, T4TOTAL, FREET4, T3FREE, THYROIDAB in the last 72 hours. Anemia Panel: No results for input(s): VITAMINB12, FOLATE, FERRITIN, TIBC, IRON, RETICCTPCT in the last 72 hours. Urine analysis: No results found for: COLORURINE, APPEARANCEUR, LABSPEC, PHURINE, GLUCOSEU, HGBUR, BILIRUBINUR, KETONESUR, PROTEINUR, UROBILINOGEN, NITRITE, LEUKOCYTESUR Sepsis Labs: @LABRCNTIP (procalcitonin:4,lacticidven:4)  Aerobic/Anaerobic Culture(surg specimen) (NOT AT [facility])     Status: None (Preliminary result)   Collection Time: 04/15/16  9:18 PM  Result Value Ref Range Status   Specimen Description ABSCESS  Final   Special Requests LEFT HAND AND ARM  Final   Gram Stain   Final   Culture NO GROWTH 1 DAY  Final   Report Status PENDING  Incomplete  MRSA PCR Screening     Status: None   Collection Time: 04/16/16  9:59 PM  Result Value Ref Range Status   MRSA by PCR NEGATIVE NEGATIVE Final      Radiology Studies: Dg Forearm Left 04/15/2016  1. Abnormal edema tracking in the dorsum and radial side of the forearm. Cellulitis is not excluded. No gas in the soft tissues noted.  Dg Hand Complete Left 04/15/2016  1. Prominent dorsal soft tissue swelling in the hand and wrist, without underlying bony abnormality or visible foreign body.   Scheduled Meds: . amoxicillin-clavulanate  1 tablet Oral Q12H  . enoxaparin (LOVENOX) injection  40 mg Subcutaneous Q24H  . morphine  15 mg Oral Q12H  . vitamin C  1,000 mg Oral Daily   Continuous Infusions: . lactated ringers       LOS: 4 days    Time spent: 15 minutes  Greater than 50% of the time spent on counseling and coordinating the care.   Manson PasseyEVINE, Taytum Wheller, MD [facility] Pager [phone]  If 7PM-7AM, please contact night-coverage [email or web address] Password [email or web address] 04/20/2016, 8:02 AM

## 2016-04-21 LAB — AEROBIC/ANAEROBIC CULTURE W GRAM STAIN (SURGICAL/DEEP WOUND): Culture: NO GROWTH

## 2016-04-21 LAB — AEROBIC/ANAEROBIC CULTURE (SURGICAL/DEEP WOUND)

## 2016-04-21 MED ORDER — AMOXICILLIN-POT CLAVULANATE 875-125 MG PO TABS: 1.0000 | ORAL_TABLET | Freq: Two times a day (BID) | ORAL | Status: DC

## 2016-04-21 MED ORDER — TRAMADOL HCL 50 MG PO TABS: 50.0000 mg | ORAL_TABLET | Freq: Four times a day (QID) | ORAL | Status: DC | PRN

## 2016-04-21 NOTE — Progress Notes (Signed)
Pt discharge education and instructions completed with pt. Report called off to Loleta [facility]arolyn [facility] [phone]. Pt IV removed and incision dsg remains clean, dry and intact. [facility]. Pt declined wheelchair and ambulated off unit with belongings and walden/guard at side. Dionne BucyP. Amo Dontrell Stuck RN

## 2016-04-21 NOTE — Discharge Summary (Signed)
Physician Discharge Summary  Derrick Rivers ZOX:[id number] DOB: 1981/01/07 DOA: 04/15/2016  PCP: No primary care provider on file.; with MD In [facility] facility   Admit date: 04/15/2016 Discharge date: 04/21/2016  Recommendations for Outpatient Follow-up:  Continue Augmentin for 10 days on discharge twice daily. Also continue tramadol for pain control as needed.  Discharge Diagnoses:  Principal Problem:   Cellulitis of left upper extremity    Discharge Condition: stable   Diet recommendation: as tolerated   History of present illness:  36 y.o. male with medical history significant for skin and soft-tissue infections involving the bilateral upper extremities s/p fasciotomy to LUE who presents to ED with two days of swelling, redness, and pain of the left dorsal hand and forearm. He was started on Keflex and Bactrim but the swelling and erythema have continued to progress. A dose of vancomycin was administered empirically and symptomatic care was provided with IV morphine. Hand surgery was consulted and the patient was taken to OR for I&D 5/23.  Hospital Course:   Assessment & Plan:  Principal Problem:  Cellulitis of left upper extremity - Status post incision and debridement 04/15/2016 by orthopedic surgery - Continue Augmentin on discharge for 10 days and follow up with hand surgery per scheduled appt  - Please note he was on vanco and zosyn prior to Augmentin    DVT prophylaxis: Lovenox subcutaneous Code Status: full code  Family Communication: Family not at the bedside, patient in police custody    Consultants:   Orthopedics/hand surgery  Procedures:   04/15/2016 Incision and drainage of left hand and distal forearm through separate incisions.  Antimicrobials:   Unasyn 5/23-5/25  Vancomycin 5/23-5/25  Augmentin 5/25 --> for 10 days on discharge    Signed:  Manson Passey, MD  [facility] 04/21/2016, 8:32 AM  Pager #: [phone]  Time spent  in minutes: less than 30 minutes   Discharge Exam: Filed Vitals:   04/20/16 2046 04/21/16 0620  BP: 141/67 132/67  Pulse: 85 63  Temp: 97.7 F (36.5 C) 98 F (36.7 C)  Resp: 17 16   Filed Vitals:   04/20/16 0838 04/20/16 1534 04/20/16 2046 04/21/16 0620  BP: 142/79 130/90 141/67 132/67  Pulse: 70 85 85 63  Temp: 98.2 F (36.8 C) 97.7 F (36.5 C) 97.7 F (36.5 C) 98 F (36.7 C)  TempSrc: Oral Oral Oral Oral  Resp: 18 18 17 16   Weight:      SpO2: 96% 100% 97% 98%    General: Pt is alert, follows commands appropriately, not in acute distress Cardiovascular: Regular rate and rhythm, S1/S2 + Respiratory: Clear to auscultation bilaterally, no wheezing, no crackles, no rhonchi Abdominal: Soft, non tender, non distended, bowel sounds +, no guarding Extremities: no edema, no cyanosis, pulses palpable bilaterally DP and PT; left forearm wrapped  Neuro: Grossly nonfocal  Discharge Instructions  Discharge Instructions    Call MD for:  difficulty breathing, headache or visual disturbances    Complete by:  As directed      Call MD for:  persistant nausea and vomiting    Complete by:  As directed      Call MD for:  severe uncontrolled pain    Complete by:  As directed      Diet - low sodium heart healthy    Complete by:  As directed      Discharge instructions    Complete by:  As directed   Continue Augmentin for 10 days on discharge twice daily. Also  continue tramadol for pain control as needed.     Increase activity slowly    Complete by:  As directed             Medication List    TAKE these medications        amoxicillin-clavulanate 875-125 MG tablet  Commonly known as:  AUGMENTIN  Take 1 tablet by mouth every 12 (twelve) hours.     traMADol 50 MG tablet  Commonly known as:  ULTRAM  Take 1 tablet (50 mg total) by mouth every 6 (six) hours as needed.           Follow-up Information    Follow up with Tami Ribas, MD.   Specialty:  Orthopedic Surgery    Contact information:   [address] [place] [address] [phone]        The results of significant diagnostics from this hospitalization (including imaging, microbiology, ancillary and laboratory) are listed below for reference.    Significant Diagnostic Studies: Dg Forearm Left  04/15/2016  CLINICAL DATA:  Swelling in the forearm. EXAM: LEFT FOREARM - 2 VIEW COMPARISON:  None. FINDINGS: The abnormal swelling in the dorsum of the hand and wrist continues along the dorsum and radial side of the forearm, where there is infiltration of the subcutaneous tissues along with soft tissue thickening. No foreign body observed. No underlying bony abnormality. IMPRESSION: 1. Abnormal edema tracking in the dorsum and radial side of the forearm. Cellulitis is not excluded. No gas in the soft tissues noted. Electronically Signed   By: Gaylyn Rong M.D.   On: 04/15/2016 18:15   Dg Hand Complete Left  04/15/2016  CLINICAL DATA:  Swelling and pain in the left hand beginning today. Tightness extending up to the elbow. Swelling. History of cellulitis. EXAM: LEFT HAND - COMPLETE 3+ VIEW COMPARISON:  None. FINDINGS: The patient flexed his fingers during imaging, reducing diagnostic sensitivity and specificity in assessing the fingers. I do not observe a foreign body. No bony destructive findings or fracture observed. There is extensive soft tissue swelling along the dorsum of the hand without gas tracking in the soft tissues. This tracks into the dorsum of the wrist as well. Lateral sclerosis in the distal phalanx of the thumb, likely incidental. IMPRESSION: 1. Prominent dorsal soft tissue swelling in the hand and wrist, without underlying bony abnormality or visible foreign body. Electronically Signed   By: Gaylyn Rong M.D.   On: 04/15/2016 18:14    Microbiology: Aerobic/Anaerobic Culture(surg specimen) (NOT AT [facility])     Status: None (Preliminary result)   Collection Time: 04/15/16  9:18 PM   Result Value Ref Range Status   Specimen Description ABSCESS  Final   Special Requests LEFT HAND AND ARM  Final   Gram Stain   Final    RARE WBC PRESENT, PREDOMINANTLY MONONUCLEAR NO ORGANISMS SEEN Gram Stain Report Called to,Read Back By and Verified With: DR.KUZMA,MD  04/16/16 MKELLY    Culture NO GROWTH 2 DAYS  Final   Report Status PENDING  Incomplete  MRSA PCR Screening     Status: None   Collection Time: 04/16/16  9:59 PM  Result Value Ref Range Status   MRSA by PCR NEGATIVE NEGATIVE Final     Labs: Basic Metabolic Panel:  Recent Labs Lab 04/15/16 1659 04/15/16 2240 04/18/16 0550  NA 139  --  139  K 3.7  --  3.8  CL 104  --  101  CO2 26  --  29  GLUCOSE  99  --  93  BUN 8  --  6  CREATININE 1.02 0.98 0.90  CALCIUM 9.3  --  9.2   Liver Function Tests:  Recent Labs Lab 04/18/16 0550  AST 20  ALT 19  ALKPHOS 73  BILITOT 0.5  PROT 6.0*  ALBUMIN 3.7   No results for input(s): LIPASE, AMYLASE in the last 168 hours. No results for input(s): AMMONIA in the last 168 hours. CBC:  Recent Labs Lab 04/15/16 1659 04/15/16 2240 04/16/16 0343 04/17/16 0548 04/18/16 0550  WBC 8.0 5.9 8.8 7.2 5.7  NEUTROABS 5.6  --   --   --   --   HGB 14.6 13.5 13.5 13.2 13.2  HCT 45.6 44.2 45.2 42.9 42.7  MCV 87.4 88.4 91.1 88.3 89.3  PLT 177 160 174 158 167   Cardiac Enzymes: No results for input(s): CKTOTAL, CKMB, CKMBINDEX, TROPONINI in the last 168 hours. BNP: BNP (last 3 results) No results for input(s): BNP in the last 8760 hours.  ProBNP (last 3 results) No results for input(s): PROBNP in the last 8760 hours.  CBG: No results for input(s): GLUCAP in the last 168 hours.

## 2016-04-21 NOTE — Progress Notes (Signed)
Physical Therapy Wound Treatment Patient Details  Name: Derrick Rivers MRN: [id number] Date of Birth: [birth year]  Today's Date: 04/21/2016 Time: 2778-2423 Time Calculation (min): 31 min  Subjective  Subjective: Pt agreeable to hydrotherapy. Patient and Family Stated Goals: Pain control and wound healing Date of Onset: 04/13/16 Prior Treatments: Medication, I&D  Pain Score:  Pt was premedicated prior to session.   Wound Assessment  Wound / Incision (Open or Dehisced) 04/17/16 Incision - Open Arm Left Proximal (Active)  Dressing Type ABD;Compression wrap;Gauze (Comment);Moist to dry 04/21/2016  9:29 AM  Dressing Changed Changed 04/21/2016  9:29 AM  Dressing Status Clean;Dry;Intact 04/21/2016  9:29 AM  Dressing Change Frequency Daily 04/21/2016  9:29 AM  Site / Wound Assessment Red 04/21/2016  9:29 AM  % Wound base Red or Granulating 95% 04/21/2016  9:29 AM  % Wound base Yellow 5% 04/21/2016  9:29 AM  % Wound base Black 0% 04/21/2016  9:29 AM  Peri-wound Assessment Intact;Induration 04/21/2016  9:29 AM  Wound Length (cm) 5 cm 04/17/2016  2:52 PM  Wound Width (cm) 2 cm 04/17/2016  2:52 PM  Wound Depth (cm) 0.4 cm 04/17/2016  2:52 PM  Undermining (cm) 1.4 at 3 o'clock, 2.5 at 1 o'clock 04/17/2016  2:52 PM  Margins Unattached edges (unapproximated) 04/21/2016  9:29 AM  Closure None 04/21/2016  9:29 AM  Drainage Amount Moderate 04/21/2016  9:29 AM  Drainage Description Other (Comment) 04/21/2016  9:29 AM  Treatment Hydrotherapy (Pulse lavage);Packing (Impregnated strip) 04/21/2016  9:29 AM     Wound / Incision (Open or Dehisced) 04/17/16 Incision - Open Hand Left (Active)  Dressing Type ABD;Gauze (Comment);Compression wrap;Moist to dry 04/21/2016  9:29 AM  Dressing Changed Changed 04/21/2016  9:29 AM  Dressing Status Clean;Dry;Intact 04/21/2016  9:29 AM  Dressing Change Frequency Daily 04/21/2016  9:29 AM  Site / Wound Assessment Red;Yellow 04/21/2016  9:29 AM  % Wound base Red or Granulating 80%  04/21/2016  9:29 AM  % Wound base Yellow 20% 04/21/2016  9:29 AM  % Wound base Black 0% 04/21/2016  9:29 AM  % Wound base Other (Comment) 0% 04/21/2016  9:29 AM  Peri-wound Assessment Intact;Erythema (blanchable);Induration;Pink 04/21/2016  9:29 AM  Wound Length (cm) 6 cm 04/17/2016  2:52 PM  Wound Width (cm) 2 cm 04/17/2016  2:52 PM  Wound Depth (cm) 1.7 cm 04/17/2016  2:52 PM  Undermining (cm) 1.5 from 3-5 o'clock, .9 from 6-8 o'clock, .5 from 9-12 o'clock 04/17/2016  2:52 PM  Margins Unattached edges (unapproximated) 04/21/2016  9:29 AM  Closure None 04/21/2016  9:29 AM  Drainage Amount Moderate 04/21/2016  9:29 AM  Drainage Description Sanguineous 04/21/2016  9:29 AM  Treatment Hydrotherapy (Pulse lavage);Packing (Impregnated strip) 04/21/2016  9:29 AM   Hydrotherapy Pulsed lavage therapy - wound location: L hand and forearm Pulsed Lavage with Suction (psi): 12 psi (8 at times due to pain) Pulsed Lavage with Suction - Normal Saline Used: 1000 mL (between the 2 wounds) Pulsed Lavage Tip: Tip with splash shield   Wound Assessment and Plan  Wound Therapy - Assess/Plan/Recommendations Wound Therapy - Clinical Statement: Pt will benefit from continued hydrotherapy to promote wound bed healing and decrease bioburden. Wound Therapy - Functional Problem List: Decreased strength and AROM of LUE, and acute pain.  Factors Delaying/Impairing Wound Healing: Infection - systemic/local Hydrotherapy Plan: Debridement;Dressing change;Patient/family education;Pulsatile lavage with suction Wound Therapy - Frequency: 6X / week Wound Therapy - Follow Up Recommendations: Other (comment) (Pt will need continued dressing changes via RN at d/c.)  Wound Plan: See above  Wound Therapy Goals- Improve the function of patient's integumentary system by progressing the wound(s) through the phases of wound healing (inflammation - proliferation - remodeling) by: Decrease Necrotic Tissue to: 0% Decrease Necrotic Tissue -  Progress: Progressing toward goal Increase Granulation Tissue to: 100% Increase Granulation Tissue - Progress: Progressing toward goal Goals/treatment plan/discharge plan were made with and agreed upon by patient/family: Yes Time For Goal Achievement: 7 days Wound Therapy - Potential for Goals: Excellent  Goals will be updated until maximal potential achieved or discharge criteria met.  Discharge criteria: when goals achieved, discharge from hospital, MD decision/surgical intervention, no progress towards goals, refusal/missing three consecutive treatments without notification or medical reason.  GP     Rolinda Roan 04/21/2016, 9:33 AM   Rolinda Roan, PT, DPT Acute Rehabilitation Services Pager: [phone]

## 2016-04-30 ENCOUNTER — Encounter (HOSPITAL_COMMUNITY): Payer: Self-pay | Admitting: *Deleted

## 2016-04-30 DIAGNOSIS — E669 Obesity, unspecified: Secondary | ICD-10-CM | POA: Diagnosis not present

## 2016-04-30 DIAGNOSIS — F17200 Nicotine dependence, unspecified, uncomplicated: Secondary | ICD-10-CM

## 2016-04-30 DIAGNOSIS — Z6836 Body mass index (BMI) 36.0-36.9, adult: Secondary | ICD-10-CM | POA: Diagnosis not present

## 2016-04-30 DIAGNOSIS — M79642 Pain in left hand: Secondary | ICD-10-CM | POA: Insufficient documentation

## 2016-04-30 DIAGNOSIS — Z9889 Other specified postprocedural states: Secondary | ICD-10-CM | POA: Insufficient documentation

## 2016-04-30 DIAGNOSIS — M7989 Other specified soft tissue disorders: Secondary | ICD-10-CM

## 2016-04-30 DIAGNOSIS — R509 Fever, unspecified: Secondary | ICD-10-CM

## 2016-04-30 DIAGNOSIS — Z79899 Other long term (current) drug therapy: Secondary | ICD-10-CM | POA: Diagnosis not present

## 2016-04-30 DIAGNOSIS — L02512 Cutaneous abscess of left hand: Secondary | ICD-10-CM | POA: Diagnosis present

## 2016-04-30 LAB — CBC WITH DIFFERENTIAL/PLATELET
BASOS ABS: 0 10*3/uL (ref 0.0–0.1)
Basophils Relative: 0 %
Eosinophils Absolute: 0.1 10*3/uL (ref 0.0–0.7)
Eosinophils Relative: 2 %
HEMATOCRIT: 46.7 % (ref 39.0–52.0)
Hemoglobin: 14.7 g/dL (ref 13.0–17.0)
LYMPHS ABS: 2.8 10*3/uL (ref 0.7–4.0)
LYMPHS PCT: 34 %
MCH: 27.4 pg (ref 26.0–34.0)
MCHC: 31.5 g/dL (ref 30.0–36.0)
MCV: 87.1 fL (ref 78.0–100.0)
MONO ABS: 0.9 10*3/uL (ref 0.1–1.0)
MONOS PCT: 11 %
NEUTROS ABS: 4.3 10*3/uL (ref 1.7–7.7)
Neutrophils Relative %: 53 %
Platelets: 235 10*3/uL (ref 150–400)
RBC: 5.36 MIL/uL (ref 4.22–5.81)
RDW: 14.4 % (ref 11.5–15.5)
WBC: 8.2 10*3/uL (ref 4.0–10.5)

## 2016-04-30 NOTE — ED Notes (Signed)
The pt has lt hand pain.  He had surgery on his lt hand  2 weeks ago.??ifthe hand had an incision and drainage.  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  He saw the hand surgery maybe dr Merlyn Lotkuzma 3 days ago and everything looked ok.  But since yesterday his hand has been burning and he has had a low grade temp  His hand pain has increased and his hand has increased in size.

## 2016-05-01 ENCOUNTER — Ambulatory Visit (HOSPITAL_BASED_OUTPATIENT_CLINIC_OR_DEPARTMENT_OTHER): Admitting: Anesthesiology

## 2016-05-01 ENCOUNTER — Encounter (HOSPITAL_BASED_OUTPATIENT_CLINIC_OR_DEPARTMENT_OTHER)
Admission: RE | Disposition: A | Discharge status: Home / Self Care | Payer: Self-pay | Source: Ambulatory Visit | Attending: Orthopedic Surgery

## 2016-05-01 ENCOUNTER — Ambulatory Visit (HOSPITAL_BASED_OUTPATIENT_CLINIC_OR_DEPARTMENT_OTHER)
Admission: RE | Admit: 2016-05-01 | Discharge: 2016-05-01 | Disposition: A | Discharge status: Home / Self Care | Source: Ambulatory Visit | Attending: Orthopedic Surgery | Admitting: Orthopedic Surgery

## 2016-05-01 ENCOUNTER — Encounter (HOSPITAL_BASED_OUTPATIENT_CLINIC_OR_DEPARTMENT_OTHER): Payer: Self-pay | Admitting: *Deleted

## 2016-05-01 ENCOUNTER — Emergency Department (HOSPITAL_COMMUNITY)
Admission: EM | Admit: 2016-05-01 | Discharge: 2016-05-01 | Disposition: A | Discharge status: Home / Self Care | Source: Home / Self Care | Attending: Emergency Medicine | Admitting: Emergency Medicine

## 2016-05-01 ENCOUNTER — Other Ambulatory Visit: Payer: Self-pay | Admitting: Orthopedic Surgery

## 2016-05-01 DIAGNOSIS — F17200 Nicotine dependence, unspecified, uncomplicated: Secondary | ICD-10-CM | POA: Insufficient documentation

## 2016-05-01 DIAGNOSIS — Z6836 Body mass index (BMI) 36.0-36.9, adult: Secondary | ICD-10-CM | POA: Diagnosis not present

## 2016-05-01 DIAGNOSIS — M7989 Other specified soft tissue disorders: Secondary | ICD-10-CM

## 2016-05-01 DIAGNOSIS — L02512 Cutaneous abscess of left hand: Secondary | ICD-10-CM | POA: Diagnosis present

## 2016-05-01 DIAGNOSIS — E669 Obesity, unspecified: Secondary | ICD-10-CM | POA: Diagnosis not present

## 2016-05-01 DIAGNOSIS — Z79899 Other long term (current) drug therapy: Secondary | ICD-10-CM | POA: Insufficient documentation

## 2016-05-01 DIAGNOSIS — M79642 Pain in left hand: Secondary | ICD-10-CM

## 2016-05-01 HISTORY — PX: I & D EXTREMITY: SHX5045

## 2016-05-01 LAB — BASIC METABOLIC PANEL
Anion gap: 9 (ref 5–15)
BUN: 6 mg/dL (ref 6–20)
CHLORIDE: 104 mmol/L (ref 101–111)
CO2: 26 mmol/L (ref 22–32)
CREATININE: 0.91 mg/dL (ref 0.61–1.24)
Calcium: 9.5 mg/dL (ref 8.9–10.3)
GFR calc non Af Amer: >60 mL/min (ref >60)
GLUCOSE: 89 mg/dL (ref 65–99)
Potassium: 4.1 mmol/L (ref 3.5–5.1)
Sodium: 139 mmol/L (ref 135–145)

## 2016-05-01 SURGERY — IRRIGATION AND DEBRIDEMENT EXTREMITY
Anesthesia: General | Site: Hand | Laterality: Left

## 2016-05-01 MED ORDER — MIDAZOLAM HCL 2 MG/2ML IJ SOLN: 1.0000 mg | INTRAMUSCULAR | Status: DC | PRN

## 2016-05-01 MED ORDER — OXYCODONE HCL 5 MG PO TABS
ORAL_TABLET | ORAL | Status: AC
  Filled 2016-05-01: qty 1

## 2016-05-01 MED ORDER — HYDROMORPHONE HCL 1 MG/ML IJ SOLN
INTRAMUSCULAR | Status: AC
  Filled 2016-05-01: qty 1

## 2016-05-01 MED ORDER — OXYCODONE-ACETAMINOPHEN 5-325 MG PO TABS
2.0000 | ORAL_TABLET | Freq: Once | ORAL | Status: AC
  Administered 2016-05-01: 2 via ORAL
  Filled 2016-05-01: qty 2

## 2016-05-01 MED ORDER — MIDAZOLAM HCL 2 MG/ML PO SYRP: 0.5000 mg/kg | ORAL_SOLUTION | Freq: Once | ORAL | Status: DC

## 2016-05-01 MED ORDER — CLINDAMYCIN HCL 150 MG PO CAPS: 300.0000 mg | ORAL_CAPSULE | Freq: Three times a day (TID) | ORAL | Status: DC

## 2016-05-01 MED ORDER — OXYCODONE-ACETAMINOPHEN 5-325 MG PO TABS: 1.0000 | ORAL_TABLET | Freq: Four times a day (QID) | ORAL | Status: DC | PRN

## 2016-05-01 MED ORDER — ONDANSETRON HCL 4 MG/2ML IJ SOLN: 4.0000 mg | Freq: Four times a day (QID) | INTRAMUSCULAR | Status: DC | PRN

## 2016-05-01 MED ORDER — CLINDAMYCIN PHOSPHATE 600 MG/50ML IV SOLN
600.0000 mg | Freq: Once | INTRAVENOUS | Status: AC
  Administered 2016-05-01: 600 mg via INTRAVENOUS
  Filled 2016-05-01: qty 50

## 2016-05-01 MED ORDER — CHLORHEXIDINE GLUCONATE 4 % EX LIQD: 60.0000 mL | Freq: Once | CUTANEOUS | Status: DC

## 2016-05-01 MED ORDER — HYDROMORPHONE HCL 1 MG/ML IJ SOLN
0.2500 mg | INTRAMUSCULAR | Status: DC | PRN
  Administered 2016-05-01 (×4): 0.5 mg via INTRAVENOUS

## 2016-05-01 MED ORDER — LACTATED RINGERS IV SOLN
INTRAVENOUS | Status: DC
  Administered 2016-05-01 (×2): via INTRAVENOUS

## 2016-05-01 MED ORDER — POVIDONE-IODINE 10 % EX SWAB: 2.0000 "application " | Freq: Once | CUTANEOUS | Status: DC

## 2016-05-01 MED ORDER — LIDOCAINE HCL (CARDIAC) 20 MG/ML IV SOLN
INTRAVENOUS | Status: DC | PRN
  Administered 2016-05-01: 60 mg via INTRAVENOUS

## 2016-05-01 MED ORDER — GLYCOPYRROLATE 0.2 MG/ML IJ SOLN: 0.2000 mg | Freq: Once | INTRAMUSCULAR | Status: DC | PRN

## 2016-05-01 MED ORDER — ONDANSETRON HCL 4 MG/2ML IJ SOLN
INTRAMUSCULAR | Status: AC
  Filled 2016-05-01: qty 2

## 2016-05-01 MED ORDER — BUPIVACAINE HCL (PF) 0.25 % IJ SOLN
INTRAMUSCULAR | Status: AC
  Filled 2016-05-01: qty 30

## 2016-05-01 MED ORDER — OXYCODONE-ACETAMINOPHEN 5-325 MG PO TABS: ORAL_TABLET | ORAL | Status: DC

## 2016-05-01 MED ORDER — PROPOFOL 10 MG/ML IV BOLUS
INTRAVENOUS | Status: DC | PRN
Start: 2016-05-01 — End: 2016-05-01
  Administered 2016-05-01: 300 mg via INTRAVENOUS

## 2016-05-01 MED ORDER — FENTANYL CITRATE (PF) 100 MCG/2ML IJ SOLN
INTRAMUSCULAR | Status: AC
  Filled 2016-05-01: qty 2

## 2016-05-01 MED ORDER — VANCOMYCIN HCL 1000 MG IV SOLR
1000.0000 mg | INTRAVENOUS | Status: DC | PRN
  Administered 2016-05-01: 1000 mg via INTRAVENOUS

## 2016-05-01 MED ORDER — VANCOMYCIN HCL 500 MG IV SOLR
INTRAVENOUS | Status: AC
  Filled 2016-05-01: qty 1000

## 2016-05-01 MED ORDER — OXYCODONE HCL 5 MG PO TABS
5.0000 mg | ORAL_TABLET | Freq: Once | ORAL | Status: AC | PRN
  Administered 2016-05-01: 5 mg via ORAL

## 2016-05-01 MED ORDER — 0.9 % SODIUM CHLORIDE (POUR BTL) OPTIME
TOPICAL | Status: DC | PRN
  Administered 2016-05-01: 500 mL

## 2016-05-01 MED ORDER — OXYCODONE HCL 5 MG/5ML PO SOLN: 5.0000 mg | Freq: Once | ORAL | Status: AC | PRN

## 2016-05-01 MED ORDER — KETOROLAC TROMETHAMINE 30 MG/ML IJ SOLN
30.0000 mg | Freq: Once | INTRAMUSCULAR | Status: AC
  Administered 2016-05-01: 30 mg via INTRAVENOUS
  Filled 2016-05-01: qty 1

## 2016-05-01 MED ORDER — FENTANYL CITRATE (PF) 100 MCG/2ML IJ SOLN
50.0000 ug | INTRAMUSCULAR | Status: AC | PRN
  Administered 2016-05-01: 50 ug via INTRAVENOUS
  Administered 2016-05-01: 100 ug via INTRAVENOUS
  Administered 2016-05-01: 50 ug via INTRAVENOUS

## 2016-05-01 MED ORDER — LIDOCAINE 2% (20 MG/ML) 5 ML SYRINGE
INTRAMUSCULAR | Status: AC
  Filled 2016-05-01: qty 5

## 2016-05-01 MED ORDER — SCOPOLAMINE 1 MG/3DAYS TD PT72: 1.0000 | MEDICATED_PATCH | Freq: Once | TRANSDERMAL | Status: DC | PRN

## 2016-05-01 MED ORDER — BUPIVACAINE HCL (PF) 0.25 % IJ SOLN
INTRAMUSCULAR | Status: DC | PRN
  Administered 2016-05-01: 10 mL

## 2016-05-01 MED ORDER — PROPOFOL 500 MG/50ML IV EMUL
INTRAVENOUS | Status: AC
  Filled 2016-05-01: qty 50

## 2016-05-01 MED ORDER — ONDANSETRON HCL 4 MG/2ML IJ SOLN
INTRAMUSCULAR | Status: DC | PRN
  Administered 2016-05-01: 4 mg via INTRAVENOUS

## 2016-05-01 SURGICAL SUPPLY — 53 items
BAG DECANTER FOR FLEXI CONT (MISCELLANEOUS) IMPLANT
BANDAGE ACE 3X5.8 VEL STRL LF (GAUZE/BANDAGES/DRESSINGS) ×3 IMPLANT
BLADE MINI RND TIP GREEN BEAV (BLADE) IMPLANT
BLADE SURG 15 STRL LF DISP TIS (BLADE) ×2 IMPLANT
BLADE SURG 15 STRL SS (BLADE) ×4
BNDG COHESIVE 1X5 TAN STRL LF (GAUZE/BANDAGES/DRESSINGS) IMPLANT
BNDG ELASTIC 2X5.8 VLCR STR LF (GAUZE/BANDAGES/DRESSINGS) IMPLANT
BNDG ESMARK 4X9 LF (GAUZE/BANDAGES/DRESSINGS) ×6 IMPLANT
BNDG GAUZE 1X2.1 STRL (MISCELLANEOUS) IMPLANT
BNDG GAUZE ELAST 4 BULKY (GAUZE/BANDAGES/DRESSINGS) ×3 IMPLANT
BRUSH SCRUB EZ PLAIN DRY (MISCELLANEOUS) ×3 IMPLANT
CHLORAPREP W/TINT 26ML (MISCELLANEOUS) IMPLANT
CORDS BIPOLAR (ELECTRODE) ×3 IMPLANT
COVER BACK TABLE 60X90IN (DRAPES) ×3 IMPLANT
COVER MAYO STAND STRL (DRAPES) ×3 IMPLANT
CUFF TOURNIQUET SINGLE 18IN (TOURNIQUET CUFF) IMPLANT
DRAPE EXTREMITY T 121X128X90 (DRAPE) ×3 IMPLANT
DRAPE SURG 17X23 STRL (DRAPES) ×3 IMPLANT
GAUZE PACKING 1/2X5YD (GAUZE/BANDAGES/DRESSINGS) ×3 IMPLANT
GAUZE PACKING IODOFORM 1/4X15 (GAUZE/BANDAGES/DRESSINGS) IMPLANT
GAUZE SPONGE 4X4 12PLY STRL (GAUZE/BANDAGES/DRESSINGS) ×3 IMPLANT
GAUZE XEROFORM 1X8 LF (GAUZE/BANDAGES/DRESSINGS) IMPLANT
GLOVE BIO SURGEON STRL SZ 6.5 (GLOVE) ×2 IMPLANT
GLOVE BIO SURGEON STRL SZ7.5 (GLOVE) ×3 IMPLANT
GLOVE BIO SURGEONS STRL SZ 6.5 (GLOVE) ×1
GLOVE BIOGEL PI IND STRL 7.0 (GLOVE) ×1 IMPLANT
GLOVE BIOGEL PI IND STRL 8 (GLOVE) ×1 IMPLANT
GLOVE BIOGEL PI INDICATOR 7.0 (GLOVE) ×2
GLOVE BIOGEL PI INDICATOR 8 (GLOVE) ×2
GLOVE EXAM NITRILE EXT CUFF MD (GLOVE) ×3 IMPLANT
GOWN STRL REUS W/ TWL LRG LVL3 (GOWN DISPOSABLE) ×1 IMPLANT
GOWN STRL REUS W/TWL LRG LVL3 (GOWN DISPOSABLE) ×2
GOWN STRL REUS W/TWL XL LVL3 (GOWN DISPOSABLE) ×3 IMPLANT
LOOP VESSEL MAXI BLUE (MISCELLANEOUS) IMPLANT
NEEDLE HYPO 25X1 1.5 SAFETY (NEEDLE) ×3 IMPLANT
NS IRRIG 1000ML POUR BTL (IV SOLUTION) ×6 IMPLANT
PACK BASIN DAY SURGERY FS (CUSTOM PROCEDURE TRAY) ×3 IMPLANT
PAD CAST 3X4 CTTN HI CHSV (CAST SUPPLIES) ×1 IMPLANT
PADDING CAST ABS 4INX4YD NS (CAST SUPPLIES)
PADDING CAST ABS COTTON 4X4 ST (CAST SUPPLIES) IMPLANT
PADDING CAST COTTON 3X4 STRL (CAST SUPPLIES) ×2
SPLINT PLASTER CAST XFAST 3X15 (CAST SUPPLIES) ×10 IMPLANT
SPLINT PLASTER XTRA FASTSET 3X (CAST SUPPLIES) ×20
STOCKINETTE 4X48 STRL (DRAPES) ×3 IMPLANT
SUT ETHILON 4 0 PS 2 18 (SUTURE) IMPLANT
SWAB COLLECTION DEVICE MRSA (MISCELLANEOUS) ×3 IMPLANT
SWAB CULTURE ESWAB REG 1ML (MISCELLANEOUS) ×3 IMPLANT
SYR BULB 3OZ (MISCELLANEOUS) ×3 IMPLANT
SYR CONTROL 10ML LL (SYRINGE) ×3 IMPLANT
TOWEL OR 17X24 6PK STRL BLUE (TOWEL DISPOSABLE) ×6 IMPLANT
TRAY DSU PREP LF (CUSTOM PROCEDURE TRAY) ×3 IMPLANT
TUBE FEEDING 5FR 15 INCH (TUBING) IMPLANT
UNDERPAD 30X30 (UNDERPADS AND DIAPERS) ×3 IMPLANT

## 2016-05-01 NOTE — ED Provider Notes (Signed)
CSN: [id number]     Arrival date & time 04/30/16  2308 History  By signing my name below, I, Derrick Rivers, attest that this documentation has been prepared under the direction and in the presence of Laurence Spates, MD. Electronically Signed: Angelene Giovanni, ED Scribe. 05/01/2016. 2:19 AM.      Chief Complaint  Patient presents with  . Hand Pain   The history is provided by the patient. No language interpreter was used.   HPI Comments: Derrick Rivers is a 35 y.o. male who presents to the Emergency Department complaining of gradually worsening left hand burning with increase swelling onset yesterday. He reports associated subjective low grade fever near 99, after feeling warm. Pt had left hand surgery 2 weeks ago and he states that he saw Dr. Merlyn Lot 3 days ago who said pt was healing well however pt notes that his swelling is increasing at an alarming rate. Pt finished his course of Augmentin today then switched to Bactrim DS tonight. Pt received 1g of Rocephin at 10 pm PTA. No other alleviating factors noted. No rash. Pt has upcoming appointment with Dr. Merlyn Lot in the am.     History reviewed. No pertinent past medical history. Past Surgical History  Procedure Laterality Date  . Fasciotomy    . I&d extremity Left 04/15/2016    Procedure: IRRIGATION AND DEBRIDEMENT LEFT HAND AND  FOREARM;  Surgeon: Betha Loa, MD;  Location: [facility] OR;  Service: Orthopedics;  Laterality: Left;   No family history on file. Social History  Substance Use Topics  . Smoking status: Current Some Day Smoker  . Smokeless tobacco: None  . Alcohol Use: No    Review of Systems  A complete 10 system review of systems was obtained and all systems are negative except as noted in the HPI and PMH.    Allergies  Review of patient's allergies indicates no known allergies.  Home Medications   Prior to Admission medications   Medication Sig Start Date End Date Taking? Authorizing Provider   amoxicillin-clavulanate (AUGMENTIN) 875-125 MG tablet Take 1 tablet by mouth every 12 (twelve) hours. 04/21/16  Yes Alison Murray, MD  gabapentin (NEURONTIN) 300 MG capsule Take 900 mg by mouth every 8 (eight) hours.   Yes Historical Provider, MD  HYDROcodone-acetaminophen (NORCO/VICODIN) 5-325 MG tablet Take 1 tablet by mouth every 6 (six) hours as needed for moderate pain.   Yes Historical Provider, MD  sulfamethoxazole-trimethoprim (BACTRIM DS,SEPTRA DS) 800-160 MG tablet Take 1 tablet by mouth every 12 (twelve) hours. Take for 10 days   Yes Historical Provider, MD  clindamycin (CLEOCIN) 150 MG capsule Take 2 capsules (300 mg total) by mouth 3 (three) times daily. 05/01/16   Laurence Spates, MD  oxyCODONE-acetaminophen (PERCOCET) 5-325 MG tablet Take 1-2 tablets by mouth every 6 (six) hours as needed for severe pain. 05/01/16   Laurence Spates, MD  traMADol (ULTRAM) 50 MG tablet Take 1 tablet (50 mg total) by mouth every 6 (six) hours as needed. Patient not taking: Reported on 05/01/2016 04/21/16   Alison Murray, MD   BP 115/59 mmHg  Pulse 68  Temp(Src) 98.8 F (37.1 C) (Oral)  Resp 16  SpO2 98% Physical Exam  Constitutional: He is oriented to person, place, and time. He appears well-developed and well-nourished. No distress.  HENT:  Head: Normocephalic and atraumatic.  Eyes: Conjunctivae are normal.  Neck: Neck supple.  Musculoskeletal:  Uniform edema of left fingers and dorsal left hand with generalized TTP, no  fluctuance  Neurological: He is alert and oriented to person, place, and time.  Skin: Skin is warm and dry. No pallor.  Psychiatric: He has a normal mood and affect. Judgment normal.  Nursing note and vitals reviewed.     ED Course  Procedures (including critical care time) DIAGNOSTIC STUDIES: Oxygen Saturation is 99% on RA, normal by my interpretation.    COORDINATION OF CARE: 2:14 AM- Pt informed of lab results. He will receive Percocet. Will consult hand  surgery for further evaluation and treatment.    Labs Review Labs Reviewed  CBC WITH DIFFERENTIAL/PLATELET  BASIC METABOLIC PANEL    Imaging Review No results found.   Laurence Spatesachel Morgan Fara Worthy, MD has personally reviewed and evaluated these lab results as part of her medical decision-making.   EKG Interpretation None      MDM   Final diagnoses:  Swelling of left hand  Left hand pain   Patient with recent incision and drainage of left hand for infection presents with 1 day of worsening swelling and pain in his left hand. Recent evaluation by his surgeon was normal in the clinic. On exam, he had uniform swelling of the dorsal left hand with swelling of fingers, no fluctuance, erythema, or drainage from the wounds. Labs including WBC count normal. I discussed with hand surgeon on call, Dr. Mina MarbleWeingold, who agreed with plan for IV clindamycin and f/u with Kuzma in clinic; pt says he has an appt today. Pt given clindamycin as well as percocet and toradol. Discussed f/u plan and gave course of clindamycin for discharge. Patient discharged in satisfactory condition.   I personally performed the services described in this documentation, which was scribed in my presence. The recorded information has been reviewed and is accurate.   Laurence Spatesachel Morgan Tamyra Fojtik, MD 05/01/16 [phone]

## 2016-05-01 NOTE — Brief Op Note (Signed)
05/01/2016  5:22 PM  PATIENT:  Derrick Rivers  35 y.o. male  PRE-OPERATIVE DIAGNOSIS:  infected left hand  POST-OPERATIVE DIAGNOSIS: left hand infection  PROCEDURE:  Procedure(s) with comments: IRRIGATION AND DEBRIDEMENT EXTREMITY (Left) - I & D Left Hand   SURGEON:  Surgeon(s) and Role:    * Betha LoaKevin Kellyann Ordway, MD - Primary  PHYSICIAN ASSISTANT:   ASSISTANTS: none   ANESTHESIA:   general  EBL:  Total I/O In: 1000 [I.V.:1000] Out: 3 [Blood:3]  BLOOD ADMINISTERED:none  DRAINS: plain packing  LOCAL MEDICATIONS USED:  MARCAINE     SPECIMEN:  Source of Specimen:  left hand  DISPOSITION OF SPECIMEN:  micro  COUNTS:  YES  TOURNIQUET:   Total Tourniquet Time Documented: Upper Arm (Left) - 18 minutes Total: Upper Arm (Left) - 18 minutes   DICTATION: .Other Dictation: Dictation Number [id number]  PLAN OF CARE: Discharge to home after PACU  PATIENT DISPOSITION:  PACU - hemodynamically stable.

## 2016-05-01 NOTE — Discharge Instructions (Addendum)

## 2016-05-01 NOTE — ED Notes (Signed)
[facility] to inquire to status of pt.  RN updated on Pt condition.

## 2016-05-01 NOTE — Progress Notes (Addendum)
From Admission to Discharge pt continued to ask for more pain medication.  Gave him Dialudid 2 mg IV and oxycodone 5 mg Po and pt wanted more IV pain medications. Officer Logan Boresvans said he will continue to ask for pain medications if he can get it. Explained I could not give it to him in discharge. Told pt I would be back after discontinuing his IV and when I came in room he was standing bedside chair, told him he needed to sit down and dress his upper body while sitting. Pt made a comment under his breath " she treats me like a dog" while he was dressing. When walking him out explained he should keep his hand above heart so his hand would not hurt as bad. Under his breath he said " Yeah right " and left hand below his waist. Pt also had stated he did not understand why he was not spending the night - told him that Dr. Merlyn LotKuzma did not write orders for him to stay the night.

## 2016-05-01 NOTE — Anesthesia Postprocedure Evaluation (Signed)
Anesthesia Post Note  Patient: Bland SpanJoseph Gudiel  Procedure(s) Performed: Procedure(s) (LRB): IRRIGATION AND DEBRIDEMENT EXTREMITY (Left)  Patient location during evaluation: PACU Anesthesia Type: General Level of consciousness: awake and alert Pain management: pain level controlled Vital Signs Assessment: post-procedure vital signs reviewed and stable Respiratory status: spontaneous breathing, nonlabored ventilation and respiratory function stable Cardiovascular status: blood pressure returned to baseline and stable Postop Assessment: no signs of nausea or vomiting Anesthetic complications: no    Last Vitals:  Filed Vitals:   05/01/16 1730 05/01/16 1745  BP: 107/71 133/89  Pulse: 71 79  Temp:    Resp: 18 21    Last Pain:  Filed Vitals:   05/01/16 1759  PainSc: 8                  Zyniah Ferraiolo A.

## 2016-05-01 NOTE — Anesthesia Procedure Notes (Signed)
Procedure Name: LMA Insertion Date/Time: 05/01/2016 4:46 PM Performed by: Curly ShoresRAFT, Mechel Haggard W Pre-anesthesia Checklist: Patient identified, Emergency Drugs available, Suction available and Patient being monitored Patient Re-evaluated:Patient Re-evaluated prior to inductionOxygen Delivery Method: Circle system utilized Preoxygenation: Pre-oxygenation with 100% oxygen Intubation Type: IV induction Ventilation: Mask ventilation without difficulty LMA: LMA inserted LMA Size: 4.0 Number of attempts: 1 Airway Equipment and Method: Bite block Placement Confirmation: positive ETCO2 and breath sounds checked- equal and bilateral Tube secured with: Tape Dental Injury: Teeth and Oropharynx as per pre-operative assessment

## 2016-05-01 NOTE — Op Note (Signed)
[id number] 

## 2016-05-01 NOTE — ED Notes (Signed)
Informed provider of Pt's complaint of pain, provider bedside to inform pt he would not get any other medication at this time

## 2016-05-01 NOTE — Interval H&P Note (Signed)
History and Physical Interval Note:  05/01/2016 4:33 PM   Over past 2 days he has had increasing pain, swelling, and erythema of the hand.  Low grade fever noted yesterday.  Derrick Rivers  has presented today for surgery, with the diagnosis of infected left hand  The various methods of treatment have been discussed with the patient. After consideration of risks, benefits and other options for treatment, the patient has consented to  Procedure(s) with comments: IRRIGATION AND DEBRIDEMENT EXTREMITY (Left) - I & D Left Hand  as a surgical intervention .  The patient's history has been reviewed, patient examined, no change in status, stable for surgery.  I have reviewed the patient's chart and labs.  Questions were answered to the patient's satisfaction.     [name] R

## 2016-05-01 NOTE — H&P (View-Only) (Signed)
Derrick Rivers is an 35 y.o. male.   Chief Complaint: left hand swelling/pain HPI: 35 yo rhd male present with guard from [facility] jail states he began to have swelling and pain of left hand starting yesterday.  Started on bactrim and has had worsening of symptoms.  Describes a throbbing burning type pain of 9/10 severity.  This has progressed into forearm.  He does not remember any injuries or wounds.  No needle sticks or bites known.  Has had fevers, chills, and nausea and did not sleep last night.  Case discussed with Alfonzo Beers, MD and her note from 04/15/16 reviewed. Xrays viewed and interpreted by me: hand and forearm films show soft tissue swelling with no fracture, dislocation, radioopaque foreign body.  CT from [facility] yesterday shows soft tissue swelling and small fluid collection on dorsum of hand.  Radiologist interpretation is soft tissue swelling consistent with cellulitis with possible abscess collection. Labs reviewed: WBC 8.0  Allergies: No Known Allergies  History reviewed. No pertinent past medical history.  Past Surgical History  Procedure Laterality Date  . Fasciotomy      Family History: History reviewed. No pertinent family history.  Social History:   reports that he has been smoking.  He does not have any smokeless tobacco history on file. He reports that he does not drink alcohol or use illicit drugs.  Medications:  (Not in a hospital admission)  Results for orders placed or performed during the hospital encounter of 04/15/16 (from the past 48 hour(s))  CBC with Differential     Status: None   Collection Time: 04/15/16  4:59 PM  Result Value Ref Range   WBC 8.0 4.0 - 10.5 K/uL   RBC 5.22 4.22 - 5.81 MIL/uL   Hemoglobin 14.6 13.0 - 17.0 g/dL   HCT 45.6 39.0 - 52.0 %   MCV 87.4 78.0 - 100.0 fL   MCH 28.0 26.0 - 34.0 pg   MCHC 32.0 30.0 - 36.0 g/dL   RDW 14.8 11.5 - 15.5 %   Platelets 177 150 - 400 K/uL   Neutrophils Relative % 70 %    Neutro Abs 5.6 1.7 - 7.7 K/uL   Lymphocytes Relative 21 %   Lymphs Abs 1.7 0.7 - 4.0 K/uL   Monocytes Relative 8 %   Monocytes Absolute 0.6 0.1 - 1.0 K/uL   Eosinophils Relative 1 %   Eosinophils Absolute 0.1 0.0 - 0.7 K/uL   Basophils Relative 0 %   Basophils Absolute 0.0 0.0 - 0.1 K/uL  Basic metabolic panel     Status: None   Collection Time: 04/15/16  4:59 PM  Result Value Ref Range   Sodium 139 135 - 145 mmol/L   Potassium 3.7 3.5 - 5.1 mmol/L   Chloride 104 101 - 111 mmol/L   CO2 26 22 - 32 mmol/L   Glucose, Bld 99 65 - 99 mg/dL   BUN 8 6 - 20 mg/dL   Creatinine, Ser 1.02 0.61 - 1.24 mg/dL   Calcium 9.3 8.9 - 10.3 mg/dL   GFR calc non Af Amer >60 >60 mL/min   GFR calc Af Amer >60 >60 mL/min    Comment: (NOTE) The eGFR has been calculated using the CKD EPI equation. This calculation has not been validated in all clinical situations. eGFR's persistently <60 mL/min signify possible Chronic Kidney Disease.    Anion gap 9 5 - 15    Dg Forearm Left  04/15/2016  CLINICAL DATA:  Swelling in the forearm.  EXAM: LEFT FOREARM - 2 VIEW COMPARISON:  None. FINDINGS: The abnormal swelling in the dorsum of the hand and wrist continues along the dorsum and radial side of the forearm, where there is infiltration of the subcutaneous tissues along with soft tissue thickening. No foreign body observed. No underlying bony abnormality. IMPRESSION: 1. Abnormal edema tracking in the dorsum and radial side of the forearm. Cellulitis is not excluded. No gas in the soft tissues noted. Electronically Signed   By: Van Clines M.D.   On: 04/15/2016 18:15   Dg Hand Complete Left  04/15/2016  CLINICAL DATA:  Swelling and pain in the left hand beginning today. Tightness extending up to the elbow. Swelling. History of cellulitis. EXAM: LEFT HAND - COMPLETE 3+ VIEW COMPARISON:  None. FINDINGS: The patient flexed his fingers during imaging, reducing diagnostic sensitivity and specificity in assessing  the fingers. I do not observe a foreign body. No bony destructive findings or fracture observed. There is extensive soft tissue swelling along the dorsum of the hand without gas tracking in the soft tissues. This tracks into the dorsum of the wrist as well. Lateral sclerosis in the distal phalanx of the thumb, likely incidental. IMPRESSION: 1. Prominent dorsal soft tissue swelling in the hand and wrist, without underlying bony abnormality or visible foreign body. Electronically Signed   By: Van Clines M.D.   On: 04/15/2016 18:14     Pertinent items are noted in HPI. Review of Systems: No chest pain, shortness of breath, vomiting, diarrhea, constipation, easy bleeding or bruising, headaches, dizziness, vision changes, fainting.   Blood pressure 122/86, pulse 77, temperature 98.2 F (36.8 C), temperature source Oral, resp. rate 18, weight 111.131 kg (245 lb), SpO2 95 %.  General appearance: alert, cooperative and appears stated age Head: Normocephalic, without obvious abnormality, atraumatic Neck: supple, symmetrical, trachea midline Resp: clear to auscultation bilaterally Cardio: regular rate and rhythm GI: non-tender Extremities: intact sensation and capillary refill all digits.  +epl/fpl/io.  Decreased motion in left hand due to swelling and pain. Pain on dorsum of hand mostly.  ttp dorsum of hand and less so volarly.  Tender in dorsal forearm.  Streaking up ulnar side of forearm.  Compartments soft.  Previous surgical scars on hand and forearm.  Two small wounds on dorsum of hand and scratches ulnar side of distal forearm. Pulses: 2+ and symmetric Skin: Skin color, texture, turgor normal. No rashes or lesions Neurologic: Grossly normal Incision/Wound: As above  Assessment/Plan Left hand/forearm abscess.  Recommend OR for incision and drainage.  Risks, benefits, and alternatives of surgery were discussed and the patient agrees with the plan of care.   Greysin Medlen R 04/15/2016,  7:56 PM

## 2016-05-01 NOTE — ED Notes (Signed)
MD at bedside. 

## 2016-05-01 NOTE — ED Notes (Signed)
IV team bedside. 

## 2016-05-01 NOTE — Anesthesia Preprocedure Evaluation (Signed)
Anesthesia Evaluation  Patient identified by MRN, date of birth, ID band Patient awake    Reviewed: Allergy & Precautions, NPO status , Patient's Chart, lab work & pertinent test results  Airway Mallampati: II   Neck ROM: full    Dental   Pulmonary Current Smoker,    breath sounds clear to auscultation       Cardiovascular negative cardio ROS   Rhythm:regular Rate:Normal     Neuro/Psych    GI/Hepatic   Endo/Other  obese  Renal/GU      Musculoskeletal   Abdominal   Peds  Hematology   Anesthesia Other Findings   Reproductive/Obstetrics                             Anesthesia Physical Anesthesia Plan  ASA: II  Anesthesia Plan: General   Post-op Pain Management:    Induction: Intravenous  Airway Management Planned: LMA  Additional Equipment:   Intra-op Plan:   Post-operative Plan:   Informed Consent: I have reviewed the patients History and Physical, chart, labs and discussed the procedure including the risks, benefits and alternatives for the proposed anesthesia with the patient or authorized representative who has indicated his/her understanding and acceptance.     Plan Discussed with: CRNA, Surgeon and Anesthesiologist  Anesthesia Plan Comments:         Anesthesia Quick Evaluation

## 2016-05-01 NOTE — Transfer of Care (Signed)
Immediate Anesthesia Transfer of Care Note  Patient: Derrick Rivers  Procedure(s) Performed: Procedure(s) with comments: IRRIGATION AND DEBRIDEMENT EXTREMITY (Left) - I & D Left Hand   Patient Location: PACU  Anesthesia Type:General  Level of Consciousness: awake, sedated and responds to stimulation  Airway & Oxygen Therapy: Patient Spontanous Breathing and Patient connected to face mask oxygen  Post-op Assessment: Report given to RN, Post -op Vital signs reviewed and stable and Patient moving all extremities  Post vital signs: Reviewed and stable  Last Vitals:  Filed Vitals:   05/01/16 1535 05/01/16 1728  BP: 129/82 116/69  Pulse: 80 74  Temp:    Resp: 24 35    Last Pain:  Filed Vitals:   05/01/16 1730  PainSc: 6          Complications: No apparent anesthesia complications

## 2016-05-02 ENCOUNTER — Encounter (HOSPITAL_BASED_OUTPATIENT_CLINIC_OR_DEPARTMENT_OTHER): Payer: Self-pay | Admitting: Orthopedic Surgery

## 2016-05-02 NOTE — Op Note (Signed)
NAMCory Roughen:  [name], [name]              ACCOUNT NO.:  [id number]  MEDICAL RECORD NO.:  [id number]  LOCATION:                               FACILITY:  [facility]  PHYSICIAN:  Betha LoaKevin Nairi Oswald, MD        DATE OF BIRTH:  11-09-1981  DATE OF PROCEDURE:  05/01/2016 DATE OF DISCHARGE:  05/01/2016                              OPERATIVE REPORT   PREOPERATIVE DIAGNOSES:  Left hand recurrent infection.  POSTOPERATIVE DIAGNOSES:  Left hand recurrent infection.  PROCEDURE:  Repeat irrigation and debridement of left hand.  SURGEON:  Betha LoaKevin Meron Bocchino, MD.  ASSISTANT:  None.  ANESTHESIA:  General.  IV FLUIDS:  Per anesthesia flow sheet.  ESTIMATED BLOOD LOSS:  Minimal.  COMPLICATIONS:  None.  SPECIMENS:  Cultures to micro.  TOURNIQUET TIME:  18 minutes.  DISPOSITION:  Stable to PACU.  INDICATIONS:  Derrick Rivers is a 35 year old male, who 2 weeks ago underwent incision and drainage of the left hand and distal forearm for abscess.  He had been doing well previously in the office 3 days ago at which time, he was doing well.  Over the past 2 days, he has had increased pain, swelling, and light erythema of the hand.  He presents today for repeat incision and drainage of the hand.  Risks, benefits, and alternatives of surgery were discussed including risk of blood loss; infection; damage to nerves, vessels, tendons, ligaments, bone; failure of surgery; need for additional surgery; complications with wound healing; continued pain; continued infection; need for repeat irrigation and debridement.  He voiced understanding of these and elected to proceed.  OPERATIVE COURSE:  After being identified preoperatively by myself, the patient and I agreed upon procedure and site of procedure.  Surgical site was marked.  The risks, benefits, and alternatives of surgery were reviewed and he wished to proceed.  Surgical consent had been signed. He was transferred to the operating room and placed on the operating room  table in supine position with left upper extremity on arm board. General anesthesia was induced by Anesthesiology.  Left upper extremity was prepped and draped in normal sterile orthopedic fashion.  Surgical pause was performed between surgeons, anesthesia, operating staff, and all were in agreement as to the patient, procedure, site procedure.  The wounds were explored.  The wound on the dorsum of the forearm has nearly healed.  On the dorsum of the hand, it was swollen and mildly erythematous prior to placing the tourniquet.  The wound had good granulation tissue.  There was no undermining of the skin.  This area was reopened both radially and ulnarly.  There was no gross purulence. There was some thin watery fluid.  Cultures were taken for aerobes and anaerobes.  Once it was felt that the entire dorsum of the hand had been reopened, the wound was copiously irrigated with sterile saline and then packed with half-inch plain packing.  This was done in case he has sensitivity to iodine, which could have caused increased pain and swelling in the hand due to the previous packing.  The iodine prep was removed from his hand with a washcloth as well.  The dorsum of the hand was injected with  10 mL of 0.25% plain Marcaine to aid in postoperative analgesia.  The wound was then dressed with sterile 4x4s and wrapped with a Kerlix bandage.  A volar splint was placed and wrapped with Kerlix and Ace bandage.  The tourniquet was deflated 18 minutes. Fingertips were pink with brisk capillary refill after deflation of tourniquet.  Operative drapes were broken down.  The patient was awoken from anesthesia safely.  He was transferred back to stretcher and taken to PACU in stable condition.  I will see him back in the office in approximately 4 days for postoperative followup.  I will give him Bactrim DS 1 p.o. b.i.d. x10 days as antibiotic coverage and Percocet 5/325, 1-2 p.o. q.6 hours p.r.n. pain,  dispensed #30.     Betha Loa, MD     KK/MEDQ  D:  05/01/2016  T:  05/02/2016  Job:  [id number]

## 2016-05-02 NOTE — Op Note (Deleted)
NAMCory Roughen:  [name], [name]              ACCOUNT NO.:  [id number]  MEDICAL RECORD NO.:  [id number]  LOCATION:                               FACILITY:  [facility]  PHYSICIAN:  Betha LoaKevin Furman Trentman, MD        DATE OF BIRTH:  11-09-1981  DATE OF PROCEDURE:  05/01/2016 DATE OF DISCHARGE:  05/01/2016                              OPERATIVE REPORT   PREOPERATIVE DIAGNOSES:  Left hand recurrent infection.  POSTOPERATIVE DIAGNOSES:  Left hand recurrent infection.  PROCEDURE:  Repeat irrigation and debridement of left hand.  SURGEON:  Betha LoaKevin Deshay Blumenfeld, MD.  ASSISTANT:  None.  ANESTHESIA:  General.  IV FLUIDS:  Per anesthesia flow sheet.  ESTIMATED BLOOD LOSS:  Minimal.  COMPLICATIONS:  None.  SPECIMENS:  Cultures to micro.  TOURNIQUET TIME:  18 minutes.  DISPOSITION:  Stable to PACU.  INDICATIONS:  Mr. Derrick Rivers is a 35 year old male, who 2 weeks ago underwent incision and drainage of the left hand and distal forearm for abscess.  He had been doing well previously in the office 3 days ago at which time, he was doing well.  Over the past 2 days, he has had increased pain, swelling, and light erythema of the hand.  He presents today for repeat incision and drainage of the hand.  Risks, benefits, and alternatives of surgery were discussed including risk of blood loss; infection; damage to nerves, vessels, tendons, ligaments, bone; failure of surgery; need for additional surgery; complications with wound healing; continued pain; continued infection; need for repeat irrigation and debridement.  He voiced understanding of these and elected to proceed.  OPERATIVE COURSE:  After being identified preoperatively by myself, the patient and I agreed upon procedure and site of procedure.  Surgical site was marked.  The risks, benefits, and alternatives of surgery were reviewed and he wished to proceed.  Surgical consent had been signed. He was transferred to the operating room and placed on the operating room  table in supine position with left upper extremity on arm board. General anesthesia was induced by Anesthesiology.  Left upper extremity was prepped and draped in normal sterile orthopedic fashion.  Surgical pause was performed between surgeons, anesthesia, operating staff, and all were in agreement as to the patient, procedure, site procedure.  The wounds were explored.  The wound on the dorsum of the forearm has nearly healed.  On the dorsum of the hand, it was swollen and mildly erythematous prior to placing the tourniquet.  The wound had good granulation tissue.  There was no undermining of the skin.  This area was reopened both radially and ulnarly.  There was no gross purulence. There was some thin watery fluid.  Cultures were taken for aerobes and anaerobes.  Once it was felt that the entire dorsum of the hand had been reopened, the wound was copiously irrigated with sterile saline and then packed with half-inch plain packing.  This was done in case he has sensitivity to iodine, which could have caused increased pain and swelling in the hand due to the previous packing.  The iodine prep was removed from his hand with a washcloth as well.  The dorsum of the hand was injected with  10 mL of 0.25% plain Marcaine to aid in postoperative analgesia.  The wound was then dressed with sterile 4x4s and wrapped with a Kerlix bandage.  A volar splint was placed and wrapped with Kerlix and Ace bandage.  The tourniquet was deflated 18 minutes. Fingertips were pink with brisk capillary refill after deflation of tourniquet.  Operative drapes were broken down.  The patient was awoken from anesthesia safely.  He was transferred back to stretcher and taken to PACU in stable condition.  I will see him back in the office in approximately 4 days for postoperative followup.  I will give him Bactrim DS 1 p.o. b.i.d. x10 days as antibiotic coverage and Percocet 5/325, 1-2 p.o. q.6 hours p.r.n. pain,  dispensed #30.     Alecea Trego, MD     KK/MEDQ  D:  05/01/2016  T:  05/02/2016  Job:  [id number] 

## 2016-05-03 LAB — AEROBIC CULTURE W GRAM STAIN (SUPERFICIAL SPECIMEN)

## 2016-05-03 LAB — AEROBIC CULTURE  (SUPERFICIAL SPECIMEN)

## 2016-05-07 ENCOUNTER — Inpatient Hospital Stay (HOSPITAL_COMMUNITY)
Admission: AD | Admit: 2016-05-07 | Discharge: 2016-05-12 | DRG: 580 | Source: Ambulatory Visit | Attending: Internal Medicine | Admitting: Internal Medicine

## 2016-05-07 DIAGNOSIS — F17200 Nicotine dependence, unspecified, uncomplicated: Secondary | ICD-10-CM | POA: Diagnosis present

## 2016-05-07 DIAGNOSIS — F1910 Other psychoactive substance abuse, uncomplicated: Secondary | ICD-10-CM | POA: Diagnosis present

## 2016-05-07 DIAGNOSIS — L02519 Cutaneous abscess of unspecified hand: Secondary | ICD-10-CM | POA: Diagnosis present

## 2016-05-07 DIAGNOSIS — Z79899 Other long term (current) drug therapy: Secondary | ICD-10-CM

## 2016-05-07 DIAGNOSIS — L03119 Cellulitis of unspecified part of limb: Secondary | ICD-10-CM | POA: Diagnosis not present

## 2016-05-07 DIAGNOSIS — L02512 Cutaneous abscess of left hand: Secondary | ICD-10-CM | POA: Diagnosis present

## 2016-05-07 DIAGNOSIS — L03019 Cellulitis of unspecified finger: Principal | ICD-10-CM | POA: Diagnosis present

## 2016-05-07 DIAGNOSIS — M79642 Pain in left hand: Secondary | ICD-10-CM | POA: Diagnosis present

## 2016-05-07 LAB — COMPREHENSIVE METABOLIC PANEL
ALBUMIN: 4.6 g/dL (ref 3.5–5.0)
ALT: 23 U/L (ref 17–63)
AST: 21 U/L (ref 15–41)
Alkaline Phosphatase: 63 U/L (ref 38–126)
Anion gap: 10 (ref 5–15)
BUN: 11 mg/dL (ref 6–20)
CHLORIDE: 103 mmol/L (ref 101–111)
CO2: 25 mmol/L (ref 22–32)
Calcium: 9.4 mg/dL (ref 8.9–10.3)
Creatinine, Ser: 0.79 mg/dL (ref 0.61–1.24)
GFR calc Af Amer: >60 mL/min (ref >60)
GFR calc non Af Amer: >60 mL/min (ref >60)
GLUCOSE: 94 mg/dL (ref 65–99)
POTASSIUM: 4.1 mmol/L (ref 3.5–5.1)
Sodium: 138 mmol/L (ref 135–145)
Total Bilirubin: 0.5 mg/dL (ref 0.3–1.2)
Total Protein: 7.5 g/dL (ref 6.5–8.1)

## 2016-05-07 LAB — CBC
HCT: 47.6 % (ref 39.0–52.0)
Hemoglobin: 15.5 g/dL (ref 13.0–17.0)
MCH: 28.2 pg (ref 26.0–34.0)
MCHC: 32.6 g/dL (ref 30.0–36.0)
MCV: 86.7 fL (ref 78.0–100.0)
PLATELETS: 264 10*3/uL (ref 150–400)
RBC: 5.49 MIL/uL (ref 4.22–5.81)
RDW: 14.2 % (ref 11.5–15.5)
WBC: 7.3 10*3/uL (ref 4.0–10.5)

## 2016-05-07 LAB — LACTIC ACID, PLASMA: LACTIC ACID, VENOUS: 1 mmol/L (ref 0.5–2.0)

## 2016-05-07 MED ORDER — SODIUM CHLORIDE 0.9 % IV SOLN
2500.0000 mg | Freq: Once | INTRAVENOUS | Status: AC
  Administered 2016-05-07: 2500 mg via INTRAVENOUS
  Filled 2016-05-07: qty 2000

## 2016-05-07 MED ORDER — ACETAMINOPHEN 650 MG RE SUPP: 650.0000 mg | Freq: Four times a day (QID) | RECTAL | Status: DC | PRN

## 2016-05-07 MED ORDER — MORPHINE SULFATE (PF) 2 MG/ML IV SOLN
2.0000 mg | INTRAVENOUS | Status: DC | PRN
  Administered 2016-05-07 – 2016-05-08 (×8): 2 mg via INTRAVENOUS
  Filled 2016-05-07 (×9): qty 1

## 2016-05-07 MED ORDER — OXYCODONE HCL 5 MG PO TABS
5.0000 mg | ORAL_TABLET | ORAL | Status: DC | PRN
  Administered 2016-05-07: 5 mg via ORAL
  Filled 2016-05-07: qty 1

## 2016-05-07 MED ORDER — MORPHINE SULFATE (PF) 2 MG/ML IV SOLN
2.0000 mg | Freq: Once | INTRAVENOUS | Status: AC
  Administered 2016-05-07: 2 mg via INTRAVENOUS
  Filled 2016-05-07: qty 1

## 2016-05-07 MED ORDER — ENOXAPARIN SODIUM 40 MG/0.4ML ~~LOC~~ SOLN: 40.0000 mg | SUBCUTANEOUS | Status: DC

## 2016-05-07 MED ORDER — ONDANSETRON HCL 4 MG/2ML IJ SOLN: 4.0000 mg | Freq: Four times a day (QID) | INTRAMUSCULAR | Status: DC | PRN

## 2016-05-07 MED ORDER — OXYCODONE HCL 5 MG PO TABS
10.0000 mg | ORAL_TABLET | ORAL | Status: AC | PRN
  Administered 2016-05-07 – 2016-05-08 (×3): 10 mg via ORAL
  Filled 2016-05-07 (×3): qty 2

## 2016-05-07 MED ORDER — PIPERACILLIN-TAZOBACTAM 3.375 G IVPB 30 MIN
3.3750 g | Freq: Three times a day (TID) | INTRAVENOUS | Status: DC
  Administered 2016-05-07 – 2016-05-12 (×14): 3.375 g via INTRAVENOUS
  Filled 2016-05-07 (×19): qty 50

## 2016-05-07 MED ORDER — DIPHENHYDRAMINE HCL 25 MG PO CAPS
ORAL_CAPSULE | ORAL | Status: AC
  Filled 2016-05-07: qty 1

## 2016-05-07 MED ORDER — ACETAMINOPHEN 325 MG PO TABS: 650.0000 mg | ORAL_TABLET | Freq: Four times a day (QID) | ORAL | Status: DC | PRN

## 2016-05-07 MED ORDER — SODIUM CHLORIDE 0.9 % IV SOLN
INTRAVENOUS | Status: DC
  Administered 2016-05-07 – 2016-05-12 (×6): via INTRAVENOUS

## 2016-05-07 MED ORDER — VANCOMYCIN HCL IN DEXTROSE 1-5 GM/200ML-% IV SOLN
1000.0000 mg | Freq: Three times a day (TID) | INTRAVENOUS | Status: DC
  Administered 2016-05-08 – 2016-05-10 (×8): 1000 mg via INTRAVENOUS
  Filled 2016-05-07 (×12): qty 200

## 2016-05-07 MED ORDER — DIPHENHYDRAMINE HCL 25 MG PO CAPS
25.0000 mg | ORAL_CAPSULE | Freq: Four times a day (QID) | ORAL | Status: DC | PRN
  Administered 2016-05-07: 25 mg via ORAL

## 2016-05-07 MED ORDER — ENOXAPARIN SODIUM 60 MG/0.6ML ~~LOC~~ SOLN
55.0000 mg | SUBCUTANEOUS | Status: DC
  Administered 2016-05-07 – 2016-05-11 (×4): 55 mg via SUBCUTANEOUS
  Filled 2016-05-07 (×6): qty 0.6

## 2016-05-07 MED ORDER — POLYETHYLENE GLYCOL 3350 17 G PO PACK: 17.0000 g | PACK | Freq: Every day | ORAL | Status: DC | PRN

## 2016-05-07 MED ORDER — ONDANSETRON HCL 4 MG PO TABS: 4.0000 mg | ORAL_TABLET | Freq: Four times a day (QID) | ORAL | Status: DC | PRN

## 2016-05-07 NOTE — H&P (Signed)
History and Physical    Derrick Rivers XBJ:[id number] DOB: [birth year] DOA: 05/07/2016  PCP: No primary care provider on file.  Patient coming from:   Chief Complaint: Left-erythema and swelling  HPI: Derrick Rivers is a 35 y.o. male with medical history significant of polysubstance abuse, currently inmate, presented from Dr. Merrilee Seashore office for worsening left 10 pain swelling erythema. [name] reporting that he developed of infection over the dorsal aspect of his left hand about 3 weeks ago, coming progressively worse. This progressed to a left hand abscess for which Dr. Merlyn Lot of orthopedic surgery performed incision and drainage on 05/01/2016. Over the past 3 days his left hand has become increasingly painful having significant erythema swelling. He states unable to flex or extend his fingers. He saw Dr. Merlyn Lot today in the office he recommended readmitted to the hospital for further evaluation and treatment. Mr. Wedel reports associated fevers, chills, nausea without vomiting.    Review of Systems: As per HPI otherwise 10 point review of systems negative.    No past medical history on file.  Past Surgical History  Procedure Laterality Date  . Fasciotomy    . I&d extremity Left 04/15/2016    Procedure: IRRIGATION AND DEBRIDEMENT LEFT HAND AND  FOREARM;  Surgeon: Betha Loa, MD;  Location: [facility] OR;  Service: Orthopedics;  Laterality: Left;  . I&d extremity Left 05/01/2016    Procedure: IRRIGATION AND DEBRIDEMENT EXTREMITY;  Surgeon: Betha Loa, MD;  Location: [facility];  Service: Orthopedics;  Laterality: Left;  I & D Left Hand      reports that he has been smoking.  He does not have any smokeless tobacco history on file. He reports that he does not drink alcohol or use illicit drugs.  No Known Allergies  Family history -Family history reviewed and is nonpertinent  Prior to Admission medications   Medication Sig Start Date End Date Taking? Authorizing Provider    gabapentin (NEURONTIN) 300 MG capsule Take 900 mg by mouth every 8 (eight) hours.    Historical Provider, MD  oxyCODONE-acetaminophen (PERCOCET) 5-325 MG tablet Take 1-2 tablets by mouth every 6 (six) hours as needed for severe pain. 05/01/16   Laurence Spates, MD  oxyCODONE-acetaminophen (PERCOCET) 5-325 MG tablet 1-2 tabs po q6 hours prn pain 05/01/16   Betha Loa, MD  sulfamethoxazole-trimethoprim (BACTRIM DS,SEPTRA DS) 800-160 MG tablet Take 1 tablet by mouth every 12 (twelve) hours. Take for 10 days    Historical Provider, MD    Physical Exam: Filed Vitals:   05/07/16 1755  BP: 140/97  Pulse: 85  Temp: 97.8 F (36.6 C)  Resp: 16  SpO2: 97%      Constitutional: NAD, He is awake and alert Filed Vitals:   05/07/16 1755  BP: 140/97  Pulse: 85  Temp: 97.8 F (36.6 C)  Resp: 16  SpO2: 97%   Eyes: PERRL, lids and conjunctivae normal ENMT: Mucous membranes are moist. Posterior pharynx clear of any exudate or lesions.Normal dentition.  Neck: normal, supple, no masses, no thyromegaly Respiratory: clear to auscultation bilaterally, no wheezing, no crackles. Normal respiratory effort. No accessory muscle use.  Cardiovascular: Regular rate and rhythm, no murmurs / rubs / gallops. No extremity edema. 2+ pedal pulses. No carotid bruits.  Abdomen: no tenderness, no masses palpated. No hepatosplenomegaly. Bowel sounds positive.  Musculoskeletal: no clubbing / cyanosis. No joint deformity upper and lower extremities. Good ROM, no contractures. Normal muscle tone.  Skin: On dorsal aspect of hand there is a 4  cm ulceration associate with significant swelling of his entire hand extending to wrist. His digits were extremely painful with passive and active movement. I did not note purulent drainage. Neurologic: CN 2-12 grossly intact. Sensation intact, DTR normal. Strength 5/5 in all 4.  Psychiatric: Normal judgment and insight. Alert and oriented x 3. Normal mood.     Labs on Admission:  I have personally reviewed following labs and imaging studies  CBC:  Recent Labs Lab 04/30/16 2339  WBC 8.2  NEUTROABS 4.3  HGB 14.7  HCT 46.7  MCV 87.1  PLT 235   Basic Metabolic Panel:  Recent Labs Lab 04/30/16 2339  NA 139  K 4.1  CL 104  CO2 26  GLUCOSE 89  BUN 6  CREATININE 0.91  CALCIUM 9.5   GFR: Estimated Creatinine Clearance: 140.1 mL/min (by C-G formula based on Cr of 0.91). Liver Function Tests: No results for input(s): AST, ALT, ALKPHOS, BILITOT, PROT, ALBUMIN in the last 168 hours. No results for input(s): LIPASE, AMYLASE in the last 168 hours. No results for input(s): AMMONIA in the last 168 hours. Coagulation Profile: No results for input(s): INR, PROTIME in the last 168 hours. Cardiac Enzymes: No results for input(s): CKTOTAL, CKMB, CKMBINDEX, TROPONINI in the last 168 hours. BNP (last 3 results) No results for input(s): PROBNP in the last 8760 hours. HbA1C: No results for input(s): HGBA1C in the last 72 hours. CBG: No results for input(s): GLUCAP in the last 168 hours. Lipid Profile: No results for input(s): CHOL, HDL, LDLCALC, TRIG, CHOLHDL, LDLDIRECT in the last 72 hours. Thyroid Function Tests: No results for input(s): TSH, T4TOTAL, FREET4, T3FREE, THYROIDAB in the last 72 hours. Anemia Panel: No results for input(s): VITAMINB12, FOLATE, FERRITIN, TIBC, IRON, RETICCTPCT in the last 72 hours. Urine analysis: No results found for: COLORURINE, APPEARANCEUR, LABSPEC, PHURINE, GLUCOSEU, HGBUR, BILIRUBINUR, KETONESUR, PROTEINUR, UROBILINOGEN, NITRITE, LEUKOCYTESUR Sepsis Labs: !!!!!!!!!!!!!!!!!!!!!!!!!!!!!!!!!!!!!!!!!!!! @LABRCNTIP (procalcitonin:4,lacticidven:4) ) Recent Results (from the past 240 hour(s))  Aerobic Culture (superficial specimen)     Status: None   Collection Time: 05/01/16  6:12 PM  Result Value Ref Range Status   Specimen Description WOUND HAND  Final   Special Requests NONE  Final   Gram Stain   Final    MODERATE WBC  PRESENT, PREDOMINANTLY MONONUCLEAR NO ORGANISMS SEEN    Culture   Final    FEW STAPHYLOCOCCUS SPECIES (COAGULASE NEGATIVE) CRITICAL RESULT CALLED TO, READ BACK BY AND VERIFIED WITH: L INMAN,RN AT 1517 05/02/16 BY L BENFIELD    Report Status 05/03/2016 FINAL  Final   Organism ID, Bacteria STAPHYLOCOCCUS SPECIES (COAGULASE NEGATIVE)  Final      Susceptibility   Staphylococcus species (coagulase negative) - MIC*    CIPROFLOXACIN 4 RESISTANT Resistant     ERYTHROMYCIN >=8 RESISTANT Resistant     GENTAMICIN 8 INTERMEDIATE Intermediate     OXACILLIN >=4 RESISTANT Resistant     TETRACYCLINE <=1 SENSITIVE Sensitive     VANCOMYCIN 2 SENSITIVE Sensitive     TRIMETH/SULFA 160 RESISTANT Resistant     CLINDAMYCIN >=8 RESISTANT Resistant     RIFAMPIN <=0.5 SENSITIVE Sensitive     Inducible Clindamycin NEGATIVE Sensitive     * FEW STAPHYLOCOCCUS SPECIES (COAGULASE NEGATIVE)     Radiological Exams on Admission: No results found.  EKG: Independently reviewed.   Assessment/Plan Active Problems:   Cellulitis and abscess of hand   Polysubstance abuse    1.  Left hand cellulitis/abscess. Mr Rietz reports developing an infection to the dorsal aspect of his  left hand 3 weeks ago that was complicated by development of an abscess for which he was evaluated by Dr. Merlyn LotKuzma of orthopedic surgery. He is taken to the operating room on 05/01/2016 undergoing incision and drainage. Patient reporting developing worsening pain swelling erythema over his left hand in the last several days and followed up with his orthopedic surgeon today. Dr. Merlyn LotKuzma evaluated his hand and recommended he be admitted to the hospital to be treated with IV antibiotic therapy. It appeared he had been on doxycycline in the outpatient setting. Will obtain an MRI of his left hand this evening. Start broad-spectrum IV antibiotic therapy with vancomycin and Zosyn. For pain management will provide oxycodone and IV morphine for severe breakthrough  pain. Await further recommendations from his orthopedic surgeon Dr. Merlyn LotKuzma.   DVT prophylaxis: Lovenox Code Status: Full code Family Communication: Not present Disposition Plan: Admit to MedSurg anticipate will require greater than 2 nights hospitalization Consults called: Dr. Merlyn LotKuzma Admission status: Inpatient   Jeralyn BennettZAMORA, Mykenzie Ebanks MD [facility] Pager [phone]  If 7PM-7AM, please contact night-coverage [email or web address] Password [email or web address]  05/07/2016, 6:24 PM

## 2016-05-07 NOTE — Progress Notes (Signed)
Pharmacy Antibiotic Note  Bland SpanJoseph Lepore is a 35 y.o. male admitted on 05/07/2016 with wound infection.  Pharmacy has been consulted for vancomycin dosing.  Plan:  Vancomycin 2500mg  IV x 1 then  Vancomycin 1000mg  IV q8h  Follow renal function, clinical course, vanc trough as indicated  Zosyn 3.375mg  IV q8h (per MD)      Temp (24hrs), Avg:97.8 F (36.6 C), Min:97.8 F (36.6 C), Max:97.8 F (36.6 C)   Recent Labs Lab 04/30/16 2339  WBC 8.2  CREATININE 0.91    Estimated Creatinine Clearance: 140.1 mL/min (by C-G formula based on Cr of 0.91).    No Known Allergies  Antimicrobials this admission: 6/14 Zosyn (MD) >> 6/14 vancomcyin >>   Thank you for allowing pharmacy to be a part of this patient's care.  Arley Phenixllen Rondle Lohse RPh 05/07/2016, 6:37 PM Pager [phone]

## 2016-05-08 ENCOUNTER — Inpatient Hospital Stay (HOSPITAL_COMMUNITY): Admitting: Certified Registered"

## 2016-05-08 ENCOUNTER — Inpatient Hospital Stay (HOSPITAL_COMMUNITY)

## 2016-05-08 ENCOUNTER — Encounter (HOSPITAL_COMMUNITY): Admission: AD | Payer: Self-pay | Source: Ambulatory Visit | Attending: Internal Medicine

## 2016-05-08 ENCOUNTER — Encounter (HOSPITAL_COMMUNITY): Payer: Self-pay | Admitting: *Deleted

## 2016-05-08 HISTORY — PX: I & D EXTREMITY: SHX5045

## 2016-05-08 LAB — BASIC METABOLIC PANEL
ANION GAP: 6 (ref 5–15)
BUN: 10 mg/dL (ref 6–20)
CALCIUM: 8.8 mg/dL — AB (ref 8.9–10.3)
CO2: 27 mmol/L (ref 22–32)
Chloride: 105 mmol/L (ref 101–111)
Creatinine, Ser: 0.89 mg/dL (ref 0.61–1.24)
Glucose, Bld: 100 mg/dL — ABNORMAL HIGH (ref 65–99)
Potassium: 3.8 mmol/L (ref 3.5–5.1)
Sodium: 138 mmol/L (ref 135–145)

## 2016-05-08 LAB — CBC
HCT: 42.1 % (ref 39.0–52.0)
HEMOGLOBIN: 13.6 g/dL (ref 13.0–17.0)
MCH: 27.8 pg (ref 26.0–34.0)
MCHC: 32.3 g/dL (ref 30.0–36.0)
MCV: 86.1 fL (ref 78.0–100.0)
Platelets: 237 10*3/uL (ref 150–400)
RBC: 4.89 MIL/uL (ref 4.22–5.81)
RDW: 14.3 % (ref 11.5–15.5)
WBC: 6.8 10*3/uL (ref 4.0–10.5)

## 2016-05-08 SURGERY — IRRIGATION AND DEBRIDEMENT EXTREMITY
Anesthesia: General | Site: Hand | Laterality: Left

## 2016-05-08 MED ORDER — LACTATED RINGERS IV SOLN
INTRAVENOUS | Status: DC | PRN
  Administered 2016-05-08: 19:00:00 via INTRAVENOUS

## 2016-05-08 MED ORDER — BUPIVACAINE HCL (PF) 0.5 % IJ SOLN
INTRAMUSCULAR | Status: AC
  Filled 2016-05-08: qty 30

## 2016-05-08 MED ORDER — ONDANSETRON HCL 4 MG/2ML IJ SOLN
INTRAMUSCULAR | Status: DC | PRN
  Administered 2016-05-08: 4 mg via INTRAVENOUS

## 2016-05-08 MED ORDER — METHOCARBAMOL 500 MG PO TABS
500.0000 mg | ORAL_TABLET | Freq: Four times a day (QID) | ORAL | Status: DC | PRN
Start: 2016-05-08 — End: 2016-05-12

## 2016-05-08 MED ORDER — GABAPENTIN 300 MG PO CAPS
900.0000 mg | ORAL_CAPSULE | Freq: Two times a day (BID) | ORAL | Status: DC
  Administered 2016-05-08 – 2016-05-12 (×8): 900 mg via ORAL
  Filled 2016-05-08 (×9): qty 3

## 2016-05-08 MED ORDER — GADOBENATE DIMEGLUMINE 529 MG/ML IV SOLN
20.0000 mL | Freq: Once | INTRAVENOUS | Status: AC | PRN
  Administered 2016-05-08: 20 mL via INTRAVENOUS

## 2016-05-08 MED ORDER — OXYCODONE HCL 5 MG PO TABS: 5.0000 mg | ORAL_TABLET | Freq: Once | ORAL | Status: DC | PRN

## 2016-05-08 MED ORDER — HYDROMORPHONE HCL 1 MG/ML IJ SOLN
0.2500 mg | INTRAMUSCULAR | Status: DC | PRN
  Administered 2016-05-08 (×4): 0.5 mg via INTRAVENOUS

## 2016-05-08 MED ORDER — IBUPROFEN 200 MG PO TABS: 600.0000 mg | ORAL_TABLET | Freq: Four times a day (QID) | ORAL | Status: DC | PRN

## 2016-05-08 MED ORDER — 0.9 % SODIUM CHLORIDE (POUR BTL) OPTIME
TOPICAL | Status: DC | PRN
  Administered 2016-05-08: 1000 mL

## 2016-05-08 MED ORDER — BUPIVACAINE HCL (PF) 0.5 % IJ SOLN
INTRAMUSCULAR | Status: DC | PRN
  Administered 2016-05-08: 10 mL

## 2016-05-08 MED ORDER — HYDROMORPHONE HCL 2 MG/ML IJ SOLN
INTRAMUSCULAR | Status: AC
  Filled 2016-05-08: qty 1

## 2016-05-08 MED ORDER — OXYCODONE HCL 5 MG PO TABS
10.0000 mg | ORAL_TABLET | ORAL | Status: DC | PRN
  Administered 2016-05-08 – 2016-05-12 (×23): 10 mg via ORAL
  Filled 2016-05-08 (×23): qty 2

## 2016-05-08 MED ORDER — PROPOFOL 10 MG/ML IV BOLUS
INTRAVENOUS | Status: DC | PRN
  Administered 2016-05-08: 200 mg via INTRAVENOUS

## 2016-05-08 MED ORDER — LACTATED RINGERS IV SOLN
INTRAVENOUS | Status: DC
  Administered 2016-05-08: 1000 mL via INTRAVENOUS

## 2016-05-08 MED ORDER — MIDAZOLAM HCL 5 MG/5ML IJ SOLN
INTRAMUSCULAR | Status: DC | PRN
  Administered 2016-05-08: 2 mg via INTRAVENOUS

## 2016-05-08 MED ORDER — MIDAZOLAM HCL 2 MG/2ML IJ SOLN
INTRAMUSCULAR | Status: AC
  Filled 2016-05-08: qty 2

## 2016-05-08 MED ORDER — HYDROMORPHONE HCL 1 MG/ML IJ SOLN
INTRAMUSCULAR | Status: AC
  Filled 2016-05-08: qty 1

## 2016-05-08 MED ORDER — OXYCODONE HCL 5 MG/5ML PO SOLN
5.0000 mg | Freq: Once | ORAL | Status: DC | PRN
  Filled 2016-05-08: qty 5

## 2016-05-08 MED ORDER — METHOCARBAMOL 1000 MG/10ML IJ SOLN
500.0000 mg | Freq: Four times a day (QID) | INTRAMUSCULAR | Status: DC | PRN
  Administered 2016-05-08: 500 mg via INTRAVENOUS
  Filled 2016-05-08: qty 550
  Filled 2016-05-08: qty 5

## 2016-05-08 MED ORDER — FENTANYL CITRATE (PF) 100 MCG/2ML IJ SOLN
INTRAMUSCULAR | Status: AC
  Filled 2016-05-08: qty 2

## 2016-05-08 MED ORDER — LIDOCAINE HCL (CARDIAC) 20 MG/ML IV SOLN
INTRAVENOUS | Status: DC | PRN
  Administered 2016-05-08: 50 mg via INTRAVENOUS

## 2016-05-08 MED ORDER — LACTATED RINGERS IV SOLN
INTRAVENOUS | Status: DC
  Administered 2016-05-08 – 2016-05-12 (×2): via INTRAVENOUS

## 2016-05-08 MED ORDER — CHLORHEXIDINE GLUCONATE 4 % EX LIQD
60.0000 mL | Freq: Once | CUTANEOUS | Status: DC
  Filled 2016-05-08: qty 60

## 2016-05-08 MED ORDER — ONDANSETRON HCL 4 MG/2ML IJ SOLN: 4.0000 mg | Freq: Once | INTRAMUSCULAR | Status: DC | PRN

## 2016-05-08 MED ORDER — FENTANYL CITRATE (PF) 100 MCG/2ML IJ SOLN
INTRAMUSCULAR | Status: DC | PRN
  Administered 2016-05-08: 100 ug via INTRAVENOUS

## 2016-05-08 MED ORDER — SODIUM CHLORIDE 0.9 % IR SOLN
Status: DC | PRN
  Administered 2016-05-08: 500 mL

## 2016-05-08 MED ORDER — SODIUM CHLORIDE 0.9 % IR SOLN
Status: DC | PRN
  Administered 2016-05-08: 1000 mL

## 2016-05-08 MED ORDER — HYDROMORPHONE HCL 1 MG/ML IJ SOLN
INTRAMUSCULAR | Status: DC | PRN
  Administered 2016-05-08: 1 mg via INTRAVENOUS
  Administered 2016-05-08 (×2): 0.5 mg via INTRAVENOUS

## 2016-05-08 MED ORDER — HYDROMORPHONE HCL 1 MG/ML IJ SOLN
0.5000 mg | INTRAMUSCULAR | Status: DC | PRN
  Administered 2016-05-08 – 2016-05-09 (×9): 1 mg via INTRAVENOUS
  Filled 2016-05-08 (×9): qty 1

## 2016-05-08 MED ORDER — SODIUM CHLORIDE 0.9 % IR SOLN
Status: AC
  Filled 2016-05-08: qty 1

## 2016-05-08 MED ORDER — ONDANSETRON HCL 4 MG/2ML IJ SOLN
INTRAMUSCULAR | Status: AC
  Filled 2016-05-08: qty 2

## 2016-05-08 MED ORDER — PROPOFOL 10 MG/ML IV BOLUS
INTRAVENOUS | Status: AC
  Filled 2016-05-08: qty 20

## 2016-05-08 MED ORDER — LIDOCAINE HCL (CARDIAC) 20 MG/ML IV SOLN
INTRAVENOUS | Status: AC
  Filled 2016-05-08: qty 5

## 2016-05-08 MED ORDER — VITAMIN C 500 MG PO TABS
1000.0000 mg | ORAL_TABLET | Freq: Every day | ORAL | Status: DC
  Administered 2016-05-08 – 2016-05-12 (×5): 1000 mg via ORAL
  Filled 2016-05-08 (×5): qty 2

## 2016-05-08 SURGICAL SUPPLY — 40 items
BAG ZIPLOCK 12X15 (MISCELLANEOUS) ×3 IMPLANT
BANDAGE ACE 4X5 VEL STRL LF (GAUZE/BANDAGES/DRESSINGS) ×3 IMPLANT
BNDG COHESIVE 4X5 TAN STRL (GAUZE/BANDAGES/DRESSINGS) ×3 IMPLANT
BNDG GAUZE ELAST 4 BULKY (GAUZE/BANDAGES/DRESSINGS) ×3 IMPLANT
CANNULA VESSEL W/WING WO/VALVE (CANNULA) ×3 IMPLANT
CLEANER TIP ELECTROSURG 2X2 (MISCELLANEOUS) ×3 IMPLANT
CORDS BIPOLAR (ELECTRODE) ×3 IMPLANT
CUFF TOURN SGL QUICK 18 (TOURNIQUET CUFF) ×3 IMPLANT
CUFF TOURN SGL QUICK 24 (TOURNIQUET CUFF) ×2
CUFF TRNQT CYL 24X4X40X1 (TOURNIQUET CUFF) ×1 IMPLANT
DRAIN PENROSE 18X1/2 LTX STRL (DRAIN) ×3 IMPLANT
DRSG PAD ABDOMINAL 8X10 ST (GAUZE/BANDAGES/DRESSINGS) ×3 IMPLANT
ELECT REM PT RETURN 9FT ADLT (ELECTROSURGICAL) ×3
ELECTRODE REM PT RTRN 9FT ADLT (ELECTROSURGICAL) ×1 IMPLANT
GAUZE IODOFORM PACK 1/2 7832 (GAUZE/BANDAGES/DRESSINGS) ×3 IMPLANT
GAUZE PACKING 1/2X5YD (GAUZE/BANDAGES/DRESSINGS) ×3 IMPLANT
GAUZE PACKING IODOFORM 1/4X15 (GAUZE/BANDAGES/DRESSINGS) ×3 IMPLANT
GAUZE SPONGE 4X4 12PLY STRL (GAUZE/BANDAGES/DRESSINGS) ×3 IMPLANT
GAUZE SPONGE 4X4 16PLY XRAY LF (GAUZE/BANDAGES/DRESSINGS) ×3 IMPLANT
GAUZE XEROFORM 5X9 LF (GAUZE/BANDAGES/DRESSINGS) ×3 IMPLANT
GLOVE BIO SURGEON STRL SZ7.5 (GLOVE) ×6 IMPLANT
GLOVE BIOGEL PI IND STRL 8 (GLOVE) ×1 IMPLANT
GLOVE BIOGEL PI INDICATOR 8 (GLOVE) ×2
GOWN SPEC L3 XXLG W/TWL (GOWN DISPOSABLE) ×3 IMPLANT
HANDPIECE INTERPULSE COAX TIP (DISPOSABLE) ×2
KIT BASIN OR (CUSTOM PROCEDURE TRAY) ×3 IMPLANT
MANIFOLD NEPTUNE II (INSTRUMENTS) ×3 IMPLANT
PACK ORTHO EXTREMITY (CUSTOM PROCEDURE TRAY) ×3 IMPLANT
PAD ABD 8X10 STRL (GAUZE/BANDAGES/DRESSINGS) ×3 IMPLANT
PAD CAST 4YDX4 CTTN HI CHSV (CAST SUPPLIES) ×1 IMPLANT
PADDING CAST ABS 4INX4YD NS (CAST SUPPLIES) ×2
PADDING CAST ABS COTTON 4X4 ST (CAST SUPPLIES) ×1 IMPLANT
PADDING CAST COTTON 4X4 STRL (CAST SUPPLIES) ×2
SET HNDPC FAN SPRY TIP SCT (DISPOSABLE) ×1 IMPLANT
SPLINT PLASTER EXTRA FAST 3X15 (CAST SUPPLIES) ×2
SPLINT PLASTER GYPS XFAST 3X15 (CAST SUPPLIES) ×1 IMPLANT
SUT ETHILON 4 0 PS 2 18 (SUTURE) ×3 IMPLANT
SUT SILK 4 0 PS 2 (SUTURE) ×3 IMPLANT
SYR 20CC LL (SYRINGE) ×3 IMPLANT
SYR CONTROL 10ML LL (SYRINGE) ×3 IMPLANT

## 2016-05-08 NOTE — Care Management Note (Signed)
Case Management Note  Patient Details  Name: Derrick Rivers MRN: [id number] Date of Birth: [birth year]  Subjective/Objective:  [age] y/o m admitted w/L hand cellulitis. [facility]                  [facility]   Expected Discharge Date:                  Expected Discharge Plan:  [facility]  In-House Referral:     Discharge planning Services  CM Consult  Post Acute Care Choice:    Choice offered to:     DME Arranged:    DME Agency:     HH Arranged:    HH Agency:     Status of Service:  In process, will continue to follow  Medicare Important Message Given:    Date Medicare IM Given:    Medicare IM give by:    Date Additional Medicare IM Given:    Additional Medicare Important Message give by:     If discussed at Long Length of Stay Meetings, dates discussed:    Additional Comments:  Lanier ClamMahabir, Jasten Guyette, RN 05/08/2016, 11:41 AM

## 2016-05-08 NOTE — Transfer of Care (Signed)
Immediate Anesthesia Transfer of Care Note  Patient: Bland SpanJoseph Leather  Procedure(s) Performed: Procedure(s): IRRIGATION AND DEBRIDEMENT, POSSIBLE FASCIOTOMY LEFT HAND (Left)  Patient Location: PACU  Anesthesia Type:General  Level of Consciousness: awake, alert  and oriented  Airway & Oxygen Therapy: Patient Spontanous Breathing and Patient connected to face mask oxygen  Post-op Assessment: Report given to RN and Post -op Vital signs reviewed and stable  Post vital signs: Reviewed and stable  Last Vitals:  Filed Vitals:   05/08/16 0515 05/08/16 1401  BP: 124/76 99/55  Pulse: 64 60  Temp: 36.5 C 36.6 C  Resp: 17 18    Last Pain:  Filed Vitals:   05/08/16 1854  PainSc: 4       Patients Stated Pain Goal: 2 (05/08/16 1537)  Complications: No apparent anesthesia complications

## 2016-05-08 NOTE — Anesthesia Preprocedure Evaluation (Signed)
Anesthesia Evaluation  Patient identified by MRN, date of birth, ID band Patient awake    Reviewed: Allergy & Precautions, NPO status , Patient's Chart, lab work & pertinent test results  Airway Mallampati: II  TM Distance: >3 FB Neck ROM: Full    Dental  (+) Teeth Intact, Dental Advisory Given   Pulmonary Current Smoker,  breath sounds clear to auscultation        Cardiovascular Rhythm:Regular Rate:Normal     Neuro/Psych    GI/Hepatic   Endo/Other    Renal/GU      Musculoskeletal   Abdominal   Peds  Hematology   Anesthesia Other Findings   Reproductive/Obstetrics                             Anesthesia Physical Anesthesia Plan  ASA: II  Anesthesia Plan: General   Post-op Pain Management:    Induction: Intravenous  Airway Management Planned: LMA  Additional Equipment:   Intra-op Plan:   Post-operative Plan:   Informed Consent: I have reviewed the patients History and Physical, chart, labs and discussed the procedure including the risks, benefits and alternatives for the proposed anesthesia with the patient or authorized representative who has indicated his/her understanding and acceptance.   Dental advisory given  Plan Discussed with: CRNA and Anesthesiologist  Anesthesia Plan Comments:         Anesthesia Quick Evaluation  

## 2016-05-08 NOTE — Progress Notes (Signed)
Subjective: Continued pain in left hand.  Describes a burning throbbing pain in dorsum of hand.  Less pain volarly.  Able to move fingers a little better than yesterday, but still painful.  MRI completed today.   Objective: Vital signs in last 24 hours: Temp:  [97.7 F (36.5 C)-99 F (37.2 C)] 97.9 F (36.6 C) (06/15 1401) Pulse Rate:  [60-83] 60 (06/15 1401) Resp:  [17-18] 18 (06/15 1401) BP: (99-134)/(55-76) 99/55 mmHg (06/15 1401) SpO2:  [97 %-99 %] 97 % (06/15 1401)  Intake/Output from previous day: 06/14 0701 - 06/15 0700 In: 1913.3 [P.O.:480; I.V.:1133.3; IV Piggyback:300] Out: -  Intake/Output this shift: Total I/O In: 240 [P.O.:240] Out: -    Recent Labs  05/07/16 1859 05/08/16 0500  HGB 15.5 13.6    Recent Labs  05/07/16 1859 05/08/16 0500  WBC 7.3 6.8  RBC 5.49 4.89  HCT 47.6 42.1  PLT 264 237    Recent Labs  05/07/16 1859 05/08/16 0500  NA 138 138  K 4.1 3.8  CL 103 105  CO2 25 27  BUN 11 10  CREATININE 0.79 0.89  GLUCOSE 94 100*  CALCIUM 9.4 8.8*   No results for input(s): LABPT, INR in the last 72 hours.  intact senstion and capillary refill all digits.  able to wiggle fingers some, but painful.  wound without purulence or erythema.  no proximal streaking.  tissues on dorsum of hand indurated.  Assessment/Plan: MRI reviewed.  Small fluid collection on radial side of hand in subcutaneous tissues that may represent reaccumulation of abscess.  MP joints not distended.  No volar abscess collection noted.  Discussed with patient MRI and exam findings.  Plan for repeat incision and drainage with possible fasciotomy depending on appearance.  Risks, benefits, and alternatives of surgery were discussed and the patient agrees with the plan of care.    Quashaun Lazalde R 05/08/2016, 6:07 PM

## 2016-05-08 NOTE — Progress Notes (Signed)
PROGRESS NOTE    Derrick Rivers  FTD:[id number] DOB: 08/29/1981 DOA: 05/07/2016 PCP: No primary care provider on file.   Brief Narrative:  Mr Priebe is a 35 year old with history of polysubstance abuse, currently an inmate, admitted to the medicine service on 05/07/2016, presented as a transfer from Dr Merrilee Seashore office for further treatment also Lantus and possible abscess of left hand. He recently underwent incision and drainage on 05/01/2016 of left hand abscess. Had been treated with doxycycline in the outpatient setting. On evaluation he had significant swelling and erythema over the dorsum of left hand. He was started on IV Zosyn and vancomycin. MRI was obtained and official radiology interpretation is pending at the time of this dictation.  Assessment & Plan:   Active Problems:   Cellulitis and abscess of hand   Polysubstance abuse   1.  Cellulitis/abscess of left hand -He presents with increasing pain, erythema, swelling of his left hand -He was started on broad-spectrum IV antibiotic therapy with IV vancomycin and Zosyn -Dr Merlyn Lot of orthopedic surgery aware of patient's hospitalization and will await further recommendations from him -MRI of his left hand was performed earlier today, official radiology interpretation is pending at the time of this dictation. This will likely determine if he needs to be taken to the operating room. -Continue oxycodone 10 mg by mouth every 6 hours as needed and morphine 2 mg IV every 3 hours as needed for pain management. Overnight patient wishing for narcotics to be escalated, on my evaluation I do not feel this is appropriate. He is actually requesting a diet and did not appear to be in 10 out of 10 pain.   DVT prophylaxis: Lovenox Code Status: Full code Family Communication: Family not available Disposition Plan: Anticipate discharge back to the correctional facility when medically stable  Consultants:   Orthopedic surgery   Antimicrobials:     Zosyn started on 05/07/2016  Vancomycin started on 05/07/2016   Subjective: Patient appears angry, he is wanting to eat something, upset that they haven't done MRI yet  Objective: Filed Vitals:   05/07/16 1755 05/07/16 2120 05/08/16 0515 05/08/16 1401  BP: 140/97 134/74 124/76 99/55  Pulse: 85 83 64 60  Temp: 97.8 F (36.6 C) 99 F (37.2 C) 97.7 F (36.5 C) 97.9 F (36.6 C)  TempSrc:  Oral Oral Oral  Resp: SpO2: 97% 99% 99% 97%    Intake/Output Summary (Last 24 hours) at 05/08/16 1529 Last data filed at 05/08/16 1224  Gross per 24 hour  Intake 2153.33 ml  Output      0 ml  Net 2153.33 ml   There were no vitals filed for this visit.  Examination:  General exam: Appears calm and comfortable  Respiratory system: Clear to auscultation. Respiratory effort normal. Cardiovascular system: S1 & S2 heard, RRR. No JVD, murmurs, rubs, gallops or clicks. No pedal edema. Gastrointestinal system: Abdomen is nondistended, soft and nontender. No organomegaly or masses felt. Normal bowel sounds heard. Central nervous system: Alert and oriented. No focal neurological deficits. Extremities: Symmetric 5 x 5 power. Psychiatry: Judgement and insight appear normal. Mood & affect appropriate.     Data Reviewed: I have personally reviewed following labs and imaging studies  CBC:  Recent Labs Lab 05/07/16 1859 05/08/16 0500  WBC 7.3 6.8  HGB 15.5 13.6  HCT 47.6 42.1  MCV 86.7 86.1  PLT 264 237   Basic Metabolic Panel:  Recent Labs Lab 05/07/16 1859 05/08/16 0500  NA 138  138  K 4.1 3.8  CL 103 105  CO2 25 27  GLUCOSE 94 100*  BUN 11 10  CREATININE 0.79 0.89  CALCIUM 9.4 8.8*   GFR: Estimated Creatinine Clearance: 143.2 mL/min (by C-G formula based on Cr of 0.89). Liver Function Tests:  Recent Labs Lab 05/07/16 1859  AST 21  ALT 23  ALKPHOS 63  BILITOT 0.5  PROT 7.5  ALBUMIN 4.6   No results for input(s): LIPASE, AMYLASE in the last 168  hours. No results for input(s): AMMONIA in the last 168 hours. Coagulation Profile: No results for input(s): INR, PROTIME in the last 168 hours. Cardiac Enzymes: No results for input(s): CKTOTAL, CKMB, CKMBINDEX, TROPONINI in the last 168 hours. BNP (last 3 results) No results for input(s): PROBNP in the last 8760 hours. HbA1C: No results for input(s): HGBA1C in the last 72 hours. CBG: No results for input(s): GLUCAP in the last 168 hours. Lipid Profile: No results for input(s): CHOL, HDL, LDLCALC, TRIG, CHOLHDL, LDLDIRECT in the last 72 hours. Thyroid Function Tests: No results for input(s): TSH, T4TOTAL, FREET4, T3FREE, THYROIDAB in the last 72 hours. Anemia Panel: No results for input(s): VITAMINB12, FOLATE, FERRITIN, TIBC, IRON, RETICCTPCT in the last 72 hours. Sepsis Labs:  Recent Labs Lab 05/07/16 1859  LATICACIDVEN 1.0    Recent Results (from the past 240 hour(s))  Aerobic Culture (superficial specimen)     Status: None   Collection Time: 05/01/16  6:12 PM  Result Value Ref Range Status   Specimen Description WOUND HAND  Final   Special Requests NONE  Final   Gram Stain   Final    MODERATE WBC PRESENT, PREDOMINANTLY MONONUCLEAR NO ORGANISMS SEEN    Culture   Final    FEW STAPHYLOCOCCUS SPECIES (COAGULASE NEGATIVE) CRITICAL RESULT CALLED TO, READ BACK BY AND VERIFIED WITH: L INMAN,RN AT 1517 05/02/16 BY L BENFIELD    Report Status 05/03/2016 FINAL  Final   Organism ID, Bacteria STAPHYLOCOCCUS SPECIES (COAGULASE NEGATIVE)  Final      Susceptibility   Staphylococcus species (coagulase negative) - MIC*    CIPROFLOXACIN 4 RESISTANT Resistant     ERYTHROMYCIN >=8 RESISTANT Resistant     GENTAMICIN 8 INTERMEDIATE Intermediate     OXACILLIN >=4 RESISTANT Resistant     TETRACYCLINE <=1 SENSITIVE Sensitive     VANCOMYCIN 2 SENSITIVE Sensitive     TRIMETH/SULFA 160 RESISTANT Resistant     CLINDAMYCIN >=8 RESISTANT Resistant     RIFAMPIN <=0.5 SENSITIVE Sensitive      Inducible Clindamycin NEGATIVE Sensitive     * FEW STAPHYLOCOCCUS SPECIES (COAGULASE NEGATIVE)         Radiology Studies: No results found.      Scheduled Meds: . enoxaparin (LOVENOX) injection  55 mg Subcutaneous Q24H  . piperacillin-tazobactam  3.375 g Intravenous Q8H  . vancomycin  1,000 mg Intravenous Q8H   Continuous Infusions: . sodium chloride 100 mL/hr at 05/07/16 1840     LOS: 1 day    Time spent: 25 min    Jeralyn BennettZAMORA, Kamil Mchaffie, MD [facility] Pager [phone]  If 7PM-7AM, please contact night-coverage [email or web address] Password [email or web address] 05/08/2016, 3:29 PM

## 2016-05-08 NOTE — Op Note (Signed)
NAMCory Roughen:  Harms, Tim              ACCOUNT NO.:  [id number]  MEDICAL RECORD NO.:  [id number]  LOCATION:  1515                         [facility]  PHYSICIAN:  Betha LoaKevin Calogero Geisen, MD        DATE OF BIRTH:  02-Feb-1981  DATE OF PROCEDURE:  05/08/2016 DATE OF DISCHARGE:                              OPERATIVE REPORT   PREOPERATIVE DIAGNOSIS:  Left hand infection.  POSTOPERATIVE DIAGNOSIS: Left hand infection.  PROCEDURE:   1. Left hand repeat irrigation debridement with new ulnar-sided Incision 2. Left hand dorsal fascial release between index/long, long/ring, and ring/small metacarpals.  SURGEON:  Betha LoaKevin Hugo Lybrand, MD.  ASSISTANT:  None.  ANESTHESIA:  General.  IV FLUIDS:  Per anesthesia flow sheet.  ESTIMATED BLOOD LOSS:  Minimal.  COMPLICATIONS:  None.  SPECIMENS:  None.  TOURNIQUET TIME:  30 minutes.  DISPOSITION:  Stable to PACU.  INDICATIONS:  Mr. Benancio Deedsewsome is a 35 year old male who has undergone 2 incision and drainage for infection of the dorsum of the left hand.  He was seen in the office yesterday with continued significant pain in the hand and the inability to flex and extend the fingers.  He was indurated on the dorsum of the hand.  It was is tender to palpation globally.  He was admitted for IV antibiotics and MRI.  His MRI came back showing a small fluid collection on the dorsum of the hand.  He continues to have pain in the hand and pain with motion of the fingers.  I recommended going to the operating room for repeat irrigation and debridement. Incision and drainage of the hand and possible fasciotomy if necessary. Risks, benefits, alternatives of surgery were discussed including the risk of blood loss; infection; damage to nerves, vessels, tendons, ligaments, bone; failure of surgery; need for additional surgery; complications with wound healing; continued pain; continued infection; need for repeat irrigation and debridement.  He voiced understanding of these  risks and elected to proceed.  OPERATIVE COURSE:  After being identified preoperatively by myself, the patient and I agreed upon procedure and site of procedure.  Surgical site was marked.  The risks, benefits, and alternatives of surgery were reviewed and wished to proceed.  Surgical consent had been signed.  He is on scheduled antibiotics.  He was transferred to the operating room and placed on the operating room table in supine position with the left upper extremity on arm board.  General anesthesia was induced by anesthesiologist. The left upper extremity was prepped and draped in normal sterile orthopedic fashion.  Surgical pause was performed between surgeons, anesthesia, and operating room staff, and all were in agreement as to the patient, procedure, site of procedure.  Tourniquet at the proximal aspect of the extremity was inflated to 250 mmHg after exsanguination of the limb with an Esmarch bandage.  The wound was explored.  The subcutaneous tissues were opened back up by spreading technique with the scissors.  There was significant scar formation noted again on the dorsum of the hand.  There was no significant purulence and reaccumulation.  There was a small hematoma on the radial side. Cultures taken for aerobes, anaerobes, and Gram stain.  An additional incision was  made at the ulnar side of the hand again carried into subcutaneous tissues by spreading technique.  There was significant scar formation.  Bipolar electrocautery was used to obtain hemostasis.  The fascia between the index and long, long and ring, and ring and small metacarpals was released.  There was no purulence underneath the fascia. The muscle was not tensor bulging after release.  The wounds were copiously irrigated with sterile saline.  They were then packed open with 0.5 inch plain gauze.  The wounds were injected with 10 mL of 0.5% plain Marcaine in addition to performing dorsal wrist block.  The  wounds were then dressed with sterile 4x4s and ABD and wrapped with a Kerlix bandage.  A volar splint placed wrapped with Kerlix and Ace bandage. Tourniquet deflated at 30 minutes.  Fingertips were pink with brisk capillary refill after deflation of tourniquet.  Operative drapes were broken down.  The patient was awoken from anesthesia safely.  He was transferred back to stretcher and taken to PACU in stable condition.  He will be continued on IV antibiotics.  We will start hydrotherapy in 2-4 days.  Of note, after anesthesia had been induced prior to start of the case, the wrist was able to be placed through a tenodesis and was not stiff.  His fingers resting in a good normal cascade.  They were not tight with passive motion.     Betha Loa, MD     KK/MEDQ  D:  05/08/2016  T:  05/08/2016  Job:  [id number]

## 2016-05-08 NOTE — Brief Op Note (Signed)
05/07/2016 - 05/08/2016  8:25 PM  PATIENT:  Derrick Rivers  35 y.o. male  PRE-OPERATIVE DIAGNOSIS:  infected left hand  POST-OPERATIVE DIAGNOSIS:  infected left hand  PROCEDURE:  Procedure(s): IRRIGATION AND DEBRIDEMENT, POSSIBLE FASCIOTOMY LEFT HAND (Left)  SURGEON:  Surgeon(s) and Role:    * Betha LoaKevin Kaito Schulenburg, MD - Primary  PHYSICIAN ASSISTANT:   ASSISTANTS: none   ANESTHESIA:   general  EBL:     BLOOD ADMINISTERED:none  DRAINS: plain packing  LOCAL MEDICATIONS USED:  MARCAINE     SPECIMEN:  Source of Specimen:  left hand  DISPOSITION OF SPECIMEN:  micro  COUNTS:  YES  TOURNIQUET:   Total Tourniquet Time Documented: Upper Arm (Left) - 30 minutes Total: Upper Arm (Left) - 30 minutes   DICTATION: .Other Dictation: Dictation Number [id number]  PLAN OF CARE: return to floor  PATIENT DISPOSITION:  PACU - hemodynamically stable.   Delay start of Pharmacological VTE agent (>24hrs) due to surgical blood loss or risk of bleeding: no

## 2016-05-08 NOTE — Op Note (Signed)
[id number] 

## 2016-05-08 NOTE — Discharge Instructions (Signed)

## 2016-05-08 NOTE — Anesthesia Procedure Notes (Signed)
Procedure Name: LMA Insertion Date/Time: 05/08/2016 7:33 PM Performed by: Enriqueta ShutterWILLIFORD, Orly Quimby D Pre-anesthesia Checklist: Patient identified, Emergency Drugs available, Suction available and Patient being monitored Patient Re-evaluated:Patient Re-evaluated prior to inductionOxygen Delivery Method: Circle system utilized Preoxygenation: Pre-oxygenation with 100% oxygen Intubation Type: IV induction Ventilation: Mask ventilation without difficulty LMA Size: 5.0 Tube type: Oral Number of attempts: 1 Placement Confirmation: positive ETCO2 and breath sounds checked- equal and bilateral Tube secured with: Tape Dental Injury: Teeth and Oropharynx as per pre-operative assessment

## 2016-05-09 ENCOUNTER — Encounter (HOSPITAL_COMMUNITY): Payer: Self-pay | Admitting: Orthopedic Surgery

## 2016-05-09 LAB — BASIC METABOLIC PANEL
Anion gap: 5 (ref 5–15)
BUN: 9 mg/dL (ref 6–20)
CHLORIDE: 105 mmol/L (ref 101–111)
CO2: 28 mmol/L (ref 22–32)
CREATININE: 0.96 mg/dL (ref 0.61–1.24)
Calcium: 8.7 mg/dL — ABNORMAL LOW (ref 8.9–10.3)
Glucose, Bld: 112 mg/dL — ABNORMAL HIGH (ref 65–99)
POTASSIUM: 4 mmol/L (ref 3.5–5.1)
SODIUM: 138 mmol/L (ref 135–145)

## 2016-05-09 LAB — CBC
HEMATOCRIT: 41 % (ref 39.0–52.0)
Hemoglobin: 13 g/dL (ref 13.0–17.0)
MCH: 27.5 pg (ref 26.0–34.0)
MCHC: 31.7 g/dL (ref 30.0–36.0)
MCV: 86.7 fL (ref 78.0–100.0)
PLATELETS: 209 10*3/uL (ref 150–400)
RBC: 4.73 MIL/uL (ref 4.22–5.81)
RDW: 14.1 % (ref 11.5–15.5)
WBC: 8.3 10*3/uL (ref 4.0–10.5)

## 2016-05-09 MED ORDER — HYDROMORPHONE HCL 1 MG/ML IJ SOLN
0.5000 mg | INTRAMUSCULAR | Status: DC | PRN
  Administered 2016-05-09 – 2016-05-10 (×7): 1.5 mg via INTRAVENOUS
  Administered 2016-05-10 (×3): 1 mg via INTRAVENOUS
  Administered 2016-05-10 – 2016-05-11 (×5): 1.5 mg via INTRAVENOUS
  Administered 2016-05-11: 1 mg via INTRAVENOUS
  Administered 2016-05-11 (×8): 1.5 mg via INTRAVENOUS
  Administered 2016-05-11: 1 mg via INTRAVENOUS
  Administered 2016-05-12 (×9): 1.5 mg via INTRAVENOUS
  Filled 2016-05-09 (×12): qty 2
  Filled 2016-05-09: qty 1
  Filled 2016-05-09 (×2): qty 2
  Filled 2016-05-09: qty 1
  Filled 2016-05-09: qty 2
  Filled 2016-05-09: qty 1
  Filled 2016-05-09 (×2): qty 2
  Filled 2016-05-09: qty 1
  Filled 2016-05-09 (×5): qty 2
  Filled 2016-05-09: qty 1
  Filled 2016-05-09 (×8): qty 2

## 2016-05-09 MED ORDER — CELECOXIB 200 MG PO CAPS
200.0000 mg | ORAL_CAPSULE | Freq: Two times a day (BID) | ORAL | Status: DC
  Administered 2016-05-09 – 2016-05-12 (×6): 200 mg via ORAL
  Filled 2016-05-09 (×7): qty 1

## 2016-05-09 NOTE — Progress Notes (Signed)
PROGRESS NOTE    Derrick Rivers  ZOX:[id number] DOB: July 28, 1981 DOA: 05/07/2016 PCP: No primary care provider on file.   Brief Narrative:  Mr Derrick Rivers is a 35 year old with history of polysubstance abuse, currently an inmate, admitted to the medicine service on 05/07/2016, presented as a transfer from Dr Merrilee Seashore office for further treatment also Lantus and possible abscess of left hand. He recently underwent incision and drainage on 05/01/2016 of left hand abscess. Had been treated with doxycycline in the outpatient setting. On evaluation he had significant swelling and erythema over the dorsum of left hand. He was started on IV Zosyn and vancomycin. MRI was obtained and official radiology interpretation is pending at the time of this dictation.  Assessment & Plan:   Active Problems:   Cellulitis and abscess of hand   Polysubstance abuse   1.  Cellulitis/abscess of left hand -He presents with increasing pain, erythema, swelling of his left hand -He was started on broad-spectrum IV antibiotic therapy with IV vancomycin and Zosyn -Dr Merlyn Lot of orthopedic surgery aware of patient's hospitalization and will await further recommendations from him -MRI of his left hand was performed earlier today, official radiology interpretation is pending at the time of this dictation. This will likely determine if he needs to be taken to the operating room. -On 05/09/2016 he underwent incision and drainage of left hand abscess, procedure was performed by Dr. Merlyn Lot of orthopedic surgery. He tolerated procedure well. Cultures pending. Remains on broad-spectrum IV antibiotic therapy with Vancomycin and Zosyn.    DVT prophylaxis: Lovenox Code Status: Full code Family Communication: Family not available Disposition Plan: Anticipate discharge back to the correctional facility when medically stable  Consultants:   Orthopedic surgery   Antimicrobials:   Zosyn started on 05/07/2016  Vancomycin started on  05/07/2016   Subjective: Complains of severe pain involving left hand   Objective: Filed Vitals:   05/08/16 2100 05/08/16 2115 05/08/16 2142 05/09/16 0427  BP: 154/97 143/95 143/83 114/70  Pulse:   77 77  Temp:  98 F (36.7 C) 98 F (36.7 C) 98.2 F (36.8 C)  TempSrc:   Oral Oral  Resp:   20 18  SpO2:   98% 96%    Intake/Output Summary (Last 24 hours) at 05/09/16 1408 Last data filed at 05/09/16 0846  Gross per 24 hour  Intake   2565 ml  Output      0 ml  Net   2565 ml   There were no vitals filed for this visit.  Examination:  General exam: Appears anxious, states having severe pain to his left hand postoperatively  Respiratory system: Clear to auscultation. Respiratory effort normal. Cardiovascular system: S1 & S2 heard, RRR. No JVD, murmurs, rubs, gallops or clicks. No pedal edema. Gastrointestinal system: Abdomen is nondistended, soft and nontender. No organomegaly or masses felt. Normal bowel sounds heard. Central nervous system: Alert and oriented. No focal neurological deficits. Extremities: left extremity is bandaged, status post incision and drainage. Did not remove bandages.  Psychiatry: Judgement and insight appear normal. Mood & affect appropriate.     Data Reviewed: I have personally reviewed following labs and imaging studies  CBC:  Recent Labs Lab 05/07/16 1859 05/08/16 0500 05/09/16 0501  WBC 7.3 6.8 8.3  HGB 15.5 13.6 13.0  HCT 47.6 42.1 41.0  MCV 86.7 86.1 86.7  PLT 264 237 209   Basic Metabolic Panel:  Recent Labs Lab 05/07/16 1859 05/08/16 0500 05/09/16 0501  NA 138 138 138  K 4.1 3.8  4.0  CL 103 105 105  CO2 25 27 28   GLUCOSE 94 100* 112*  BUN 11 10 9   CREATININE 0.79 0.89 0.96  CALCIUM 9.4 8.8* 8.7*   GFR: Estimated Creatinine Clearance: 132.8 mL/min (by C-G formula based on Cr of 0.96). Liver Function Tests:  Recent Labs Lab 05/07/16 1859  AST 21  ALT 23  ALKPHOS 63  BILITOT 0.5  PROT 7.5  ALBUMIN 4.6   No  results for input(s): LIPASE, AMYLASE in the last 168 hours. No results for input(s): AMMONIA in the last 168 hours. Coagulation Profile: No results for input(s): INR, PROTIME in the last 168 hours. Cardiac Enzymes: No results for input(s): CKTOTAL, CKMB, CKMBINDEX, TROPONINI in the last 168 hours. BNP (last 3 results) No results for input(s): PROBNP in the last 8760 hours. HbA1C: No results for input(s): HGBA1C in the last 72 hours. CBG: No results for input(s): GLUCAP in the last 168 hours. Lipid Profile: No results for input(s): CHOL, HDL, LDLCALC, TRIG, CHOLHDL, LDLDIRECT in the last 72 hours. Thyroid Function Tests: No results for input(s): TSH, T4TOTAL, FREET4, T3FREE, THYROIDAB in the last 72 hours. Anemia Panel: No results for input(s): VITAMINB12, FOLATE, FERRITIN, TIBC, IRON, RETICCTPCT in the last 72 hours. Sepsis Labs:  Recent Labs Lab 05/07/16 1859  LATICACIDVEN 1.0    Recent Results (from the past 240 hour(s))  Aerobic Culture (superficial specimen)     Status: None   Collection Time: 05/01/16  6:12 PM  Result Value Ref Range Status   Specimen Description WOUND HAND  Final   Special Requests NONE  Final   Gram Stain   Final    MODERATE WBC PRESENT, PREDOMINANTLY MONONUCLEAR NO ORGANISMS SEEN    Culture   Final    FEW STAPHYLOCOCCUS SPECIES (COAGULASE NEGATIVE) CRITICAL RESULT CALLED TO, READ BACK BY AND VERIFIED WITH: L INMAN,RN AT 1517 05/02/16 BY L BENFIELD    Report Status 05/03/2016 FINAL  Final   Organism ID, Bacteria STAPHYLOCOCCUS SPECIES (COAGULASE NEGATIVE)  Final      Susceptibility   Staphylococcus species (coagulase negative) - MIC*    CIPROFLOXACIN 4 RESISTANT Resistant     ERYTHROMYCIN >=8 RESISTANT Resistant     GENTAMICIN 8 INTERMEDIATE Intermediate     OXACILLIN >=4 RESISTANT Resistant     TETRACYCLINE <=1 SENSITIVE Sensitive     VANCOMYCIN 2 SENSITIVE Sensitive     TRIMETH/SULFA 160 RESISTANT Resistant     CLINDAMYCIN >=8 RESISTANT  Resistant     RIFAMPIN <=0.5 SENSITIVE Sensitive     Inducible Clindamycin NEGATIVE Sensitive     * FEW STAPHYLOCOCCUS SPECIES (COAGULASE NEGATIVE)  Aerobic/Anaerobic Culture (surgical/deep wound)     Status: None (Preliminary result)   Collection Time: 05/08/16  7:58 PM  Result Value Ref Range Status   Specimen Description WOUND L HAND SURG WND  Final   Special Requests PT ON ZOSYN  Final   Gram Stain   Final    RARE WBC PRESENT, PREDOMINANTLY MONONUCLEAR NO ORGANISMS SEEN    Culture PENDING  Incomplete   Report Status PENDING  Incomplete         Radiology Studies: Mr Hand Left W Wo Contrast  05/08/2016  CLINICAL DATA:  Left hand pain and swelling. The patient developed an infection of the left hand approximately 4 weeks ago. Status post incision and drainage of the left hand times two, last on 05/01/2016. EXAM: MRI OF THE LEFT HAND WITHOUT AND WITH CONTRAST TECHNIQUE: Multiplanar, multisequence MR imaging was performed both  before and after administration of intravenous contrast. CONTRAST:  20mL MULTIHANCE GADOBENATE DIMEGLUMINE 529 MG/ML IV SOLN COMPARISON:  Plain films left hand 04/15/2016 FINDINGS: Subcutaneous edema and enhancement are identified over the dorsum of the hand. There is a skin defect at approximately the level of the distal third metacarpal most consistent with site of prior incision and drainage. A rim enhancing fluid collection in the subcutaneous tissues over the dorsum of the hand measures 0.9 cm long by 0.3 cm AP by 0.5 cm transverse. The collection is between the second and third metacarpals at the level of the metaphysis of these bones. No other findings to suggest abscess is identified. All intrinsic musculature of the hand demonstrates normal signal in there is no intramuscular fluid collection. Bone marrow signal is normal throughout. IMPRESSION: Findings most consistent with cellulitis of the dorsal soft tissues of the left hand with a possible abscess measuring  0.9 x 0.3 x 0.5 cm between the distal metaphyses of the second and third metacarpals. There is no evidence of myositis or osteomyelitis. Electronically Signed   By: Drusilla Kannerhomas  Dalessio M.D.   On: 05/08/2016 15:36        Scheduled Meds: . enoxaparin (LOVENOX) injection  55 mg Subcutaneous Q24H  . gabapentin  900 mg Oral BID  . piperacillin-tazobactam  3.375 g Intravenous Q8H  . vancomycin  1,000 mg Intravenous Q8H  . vitamin C  1,000 mg Oral Daily   Continuous Infusions: . sodium chloride Stopped (05/08/16 2218)  . lactated ringers 100 mL/hr at 05/08/16 2218     LOS: 2 days    Time spent: 25 min    Jeralyn BennettZAMORA, Vang Kraeger, MD [facility] Pager [phone]  If 7PM-7AM, please contact night-coverage [email or web address] Password [email or web address] 05/09/2016, 2:08 PM

## 2016-05-09 NOTE — Progress Notes (Signed)
Subjective: 1 Day Post-Op Procedure(s) (LRB): IRRIGATION AND DEBRIDEMENT, FASCIOTOMY LEFT HAND (Left) Patient reports pain as 7/10.  Dilaudid is helpful, but only lasts ~ 45 minutes.  .    Objective: Vital signs in last 24 hours: Temp:  [97.7 F (36.5 C)-98.2 F (36.8 C)] 98.1 F (36.7 C) (06/16 1454) Pulse Rate:  [69-79] 69 (06/16 1454) Resp:  [8-20] 20 (06/16 1454) BP: (100-178)/(57-124) 100/57 mmHg (06/16 1454) SpO2:  [96 %-100 %] 98 % (06/16 1454)  Intake/Output from previous day: 06/15 0701 - 06/16 0700 In: 2565 [P.O.:240; I.V.:1770; IV Piggyback:555] Out: 0  Intake/Output this shift: Total I/O In: 240 [P.O.:240] Out: -    Recent Labs  05/07/16 1859 05/08/16 0500 05/09/16 0501  HGB 15.5 13.6 13.0    Recent Labs  05/08/16 0500 05/09/16 0501  WBC 6.8 8.3  RBC 4.89 4.73  HCT 42.1 41.0  PLT 237 209    Recent Labs  05/08/16 0500 05/09/16 0501  NA 138 138  K 3.8 4.0  CL 105 105  CO2 27 28  BUN 10 9  CREATININE 0.89 0.96  GLUCOSE 100* 112*  CALCIUM 8.8* 8.7*   No results for input(s): LABPT, INR in the last 72 hours.  Improved motion in digits.  no proximal streaking.  dressing c/d/i.  Assessment/Plan: 1 Day Post-Op Procedure(s) (LRB): IRRIGATION AND DEBRIDEMENT, FASCIOTOMY LEFT HAND (Left) IV Abx.  Will start hydrotherapy.  Agreed to short term increase in pain medication.  Will also switch from ibuprofen to celebrex 200 mg po bid for pain control.    Katharine Rochefort R 05/09/2016, 5:00 PM

## 2016-05-10 LAB — CBC
HEMATOCRIT: 44.8 % (ref 39.0–52.0)
HEMOGLOBIN: 14.1 g/dL (ref 13.0–17.0)
MCH: 27.9 pg (ref 26.0–34.0)
MCHC: 31.5 g/dL (ref 30.0–36.0)
MCV: 88.7 fL (ref 78.0–100.0)
Platelets: 214 10*3/uL (ref 150–400)
RBC: 5.05 MIL/uL (ref 4.22–5.81)
RDW: 14.2 % (ref 11.5–15.5)
WBC: 7.9 10*3/uL (ref 4.0–10.5)

## 2016-05-10 LAB — BASIC METABOLIC PANEL
ANION GAP: 8 (ref 5–15)
BUN: 9 mg/dL (ref 6–20)
CHLORIDE: 102 mmol/L (ref 101–111)
CO2: 29 mmol/L (ref 22–32)
Calcium: 9 mg/dL (ref 8.9–10.3)
Creatinine, Ser: 0.87 mg/dL (ref 0.61–1.24)
GFR calc non Af Amer: >60 mL/min (ref >60)
Glucose, Bld: 81 mg/dL (ref 65–99)
POTASSIUM: 3.9 mmol/L (ref 3.5–5.1)
SODIUM: 139 mmol/L (ref 135–145)

## 2016-05-10 LAB — VANCOMYCIN, TROUGH: VANCOMYCIN TR: 9 ug/mL — AB (ref 10.0–20.0)

## 2016-05-10 MED ORDER — VANCOMYCIN HCL 10 G IV SOLR
1250.0000 mg | Freq: Three times a day (TID) | INTRAVENOUS | Status: DC
  Administered 2016-05-11 – 2016-05-12 (×4): 1250 mg via INTRAVENOUS
  Filled 2016-05-10 (×7): qty 1250

## 2016-05-10 NOTE — Progress Notes (Addendum)
Pharmacy Antibiotic Note  Derrick Rivers is a 35 y.o. male admitted on 05/07/2016 with wound infection of L hand. MRI 6/15 with cellulitis and possible abscess, no evidence of osteomyelitis. On Doxycyline prior to admission.  Pharmacy has been consulted for vancomycin dosing, patient also on zosyn.  I&D performed 6/15  05/10/2016  Day #3 antibiotics  Renal: Scr stable and WNL  WBC WNL  No fevers  Cultures from 6/8 with MR-CoNS  Plan:  Continue Vancomycin 1000mg  IV q8h  Check trough this evening ADDENDUM: vancomycin trough = 289mcg/ml (goal 10-7615mcg/ml) so will increase vancomycin to 1250mg  IV q8h  Follow renal function, clinical course, vanc trough as indicated  Zosyn 3.375mg  IV q8h (per MD) - ? D/c based on 6/8 culture with methicillin-resistant coag neg staphylococcus  Temp (24hrs), Avg:98.4 F (36.9 C), Min:98.1 F (36.7 C), Max:98.8 F (37.1 C)   Recent Labs Lab 05/07/16 1859 05/08/16 0500 05/09/16 0501 05/10/16 0551  WBC 7.3 6.8 8.3 7.9  CREATININE 0.79 0.89 0.96 0.87  LATICACIDVEN 1.0  --   --   --     Estimated Creatinine Clearance: 146.5 mL/min (by C-G formula based on Cr of 0.87).    Allergies  Allergen Reactions  . Multihance [Gadobenate] Nausea And Vomiting    Antimicrobials this admission:  6/14 Zosyn (MD) >>  6/14 vancomycin>>   Dose adjustments this admission:  Missed 8pm vanco dose 6/15 while in OR  6/17 1900 VT = 9 mcg/ml on vanco 1gm q8h (prior to 6th dose)   Microbiology results:  6/15 Hand wound (OR culture): collected  6/8 Hand wound: Methicillin-resistant Coag neg staph   Thank you for allowing pharmacy to be a part of this patient's care.  Juliette Alcideustin Braxon Suder, PharmD, BCPS.   Pager: [phone] 05/10/2016 12:18 PM

## 2016-05-10 NOTE — Progress Notes (Signed)
PROGRESS NOTE    Derrick Rivers  ZOX:[id number] DOB: 10/05/1981 DOA: 05/07/2016 PCP: No primary care provider on file.   Brief Narrative:  Derrick Rivers is a 35 year old with history of polysubstance abuse, currently an inmate, admitted to the medicine service on 05/07/2016, presented as a transfer from Dr Merrilee Seashore office for further treatment also Lantus and possible abscess of left hand. He recently underwent incision and drainage on 05/01/2016 of left hand abscess. Had been treated with doxycycline in the outpatient setting. On evaluation he had significant swelling and erythema over the dorsum of left hand. He was started on IV Zosyn and vancomycin.   Assessment & Plan:   Active Problems:   Cellulitis and abscess of hand   Polysubstance abuse   1.  Cellulitis/abscess of left hand -He presents with increasing pain, erythema, swelling of his left hand -He was started on broad-spectrum IV antibiotic therapy with IV vancomycin and Zosyn -Dr Merlyn Lot of orthopedic surgery aware of patient's hospitalization and will await further recommendations from him -MRI of his left hand was performed earlier today, official radiology interpretation is pending at the time of this dictation. This will likely determine if he needs to be taken to the operating room. -On 05/09/2016 he underwent incision and drainage of left hand abscess, procedure was performed by Dr. Merlyn Lot of orthopedic surgery. He tolerated procedure well.  -Intraoperative cultures showing no growth to date. -Orthopedic surgery recommending starting hydrotherapy -Remains on IV vancomycin and Zosyn   DVT prophylaxis: Lovenox Code Status: Full code Family Communication: Family not available Disposition Plan: Anticipate discharge back to the correctional facility when medically stable  Consultants:   Orthopedic surgery   Antimicrobials:   Zosyn started on 05/07/2016  Vancomycin started on 05/07/2016   Subjective: He reports  getting IV Dilaudid every 2 hours for his pain symptoms.  Objective: Filed Vitals:   05/09/16 0427 05/09/16 1454 05/09/16 2100 05/10/16 0521  BP: 114/70 100/57 132/101 121/76  Pulse: 77 69 82 65  Temp: 98.2 F (36.8 C) 98.1 F (36.7 C) 98.8 F (37.1 C) 98.2 F (36.8 C)  TempSrc: Oral Oral Oral Oral  Resp: 18 20 20 20   SpO2: 96% 98% 96% 97%    Intake/Output Summary (Last 24 hours) at 05/10/16 1421 Last data filed at 05/10/16 0600  Gross per 24 hour  Intake   2380 ml  Output      0 ml  Net   2380 ml   There were no vitals filed for this visit.  Examination:  General exam: He appears calmer, awake and alert Respiratory system: Clear to auscultation. Respiratory effort normal. Cardiovascular system: S1 & S2 heard, RRR. No JVD, murmurs, rubs, gallops or clicks. No pedal edema. Gastrointestinal system: Abdomen is nondistended, soft and nontender. No organomegaly or masses felt. Normal bowel sounds heard. Central nervous system: Alert and oriented. No focal neurological deficits. Extremities: left extremity is bandaged, status post incision and drainage. Did not remove bandages.  Psychiatry: Judgement and insight appear normal. Mood & affect appropriate.     Data Reviewed: I have personally reviewed following labs and imaging studies  CBC:  Recent Labs Lab 05/07/16 1859 05/08/16 0500 05/09/16 0501 05/10/16 0551  WBC 7.3 6.8 8.3 7.9  HGB 15.5 13.6 13.0 14.1  HCT 47.6 42.1 41.0 44.8  MCV 86.7 86.1 86.7 88.7  PLT 264 237 209 214   Basic Metabolic Panel:  Recent Labs Lab 05/07/16 1859 05/08/16 0500 05/09/16 0501 05/10/16 0551  NA 138 138 138 139  K 4.1 3.8 4.0 3.9  CL 103 105 105 102  CO2 25 27 28 29   GLUCOSE 94 100* 112* 81  BUN 11 10 9 9   CREATININE 0.79 0.89 0.96 0.87  CALCIUM 9.4 8.8* 8.7* 9.0   GFR: Estimated Creatinine Clearance: 146.5 mL/min (by C-G formula based on Cr of 0.87). Liver Function Tests:  Recent Labs Lab 05/07/16 1859  AST 21   ALT 23  ALKPHOS 63  BILITOT 0.5  PROT 7.5  ALBUMIN 4.6   No results for input(s): LIPASE, AMYLASE in the last 168 hours. No results for input(s): AMMONIA in the last 168 hours. Coagulation Profile: No results for input(s): INR, PROTIME in the last 168 hours. Cardiac Enzymes: No results for input(s): CKTOTAL, CKMB, CKMBINDEX, TROPONINI in the last 168 hours. BNP (last 3 results) No results for input(s): PROBNP in the last 8760 hours. HbA1C: No results for input(s): HGBA1C in the last 72 hours. CBG: No results for input(s): GLUCAP in the last 168 hours. Lipid Profile: No results for input(s): CHOL, HDL, LDLCALC, TRIG, CHOLHDL, LDLDIRECT in the last 72 hours. Thyroid Function Tests: No results for input(s): TSH, T4TOTAL, FREET4, T3FREE, THYROIDAB in the last 72 hours. Anemia Panel: No results for input(s): VITAMINB12, FOLATE, FERRITIN, TIBC, IRON, RETICCTPCT in the last 72 hours. Sepsis Labs:  Recent Labs Lab 05/07/16 1859  LATICACIDVEN 1.0    Recent Results (from the past 240 hour(s))  Aerobic Culture (superficial specimen)     Status: None   Collection Time: 05/01/16  6:12 PM  Result Value Ref Range Status   Specimen Description WOUND HAND  Final   Special Requests NONE  Final   Gram Stain   Final    MODERATE WBC PRESENT, PREDOMINANTLY MONONUCLEAR NO ORGANISMS SEEN    Culture   Final    FEW STAPHYLOCOCCUS SPECIES (COAGULASE NEGATIVE) CRITICAL RESULT CALLED TO, READ BACK BY AND VERIFIED WITH: L INMAN,RN AT 1517 05/02/16 BY L BENFIELD    Report Status 05/03/2016 FINAL  Final   Organism ID, Bacteria STAPHYLOCOCCUS SPECIES (COAGULASE NEGATIVE)  Final      Susceptibility   Staphylococcus species (coagulase negative) - MIC*    CIPROFLOXACIN 4 RESISTANT Resistant     ERYTHROMYCIN >=8 RESISTANT Resistant     GENTAMICIN 8 INTERMEDIATE Intermediate     OXACILLIN >=4 RESISTANT Resistant     TETRACYCLINE <=1 SENSITIVE Sensitive     VANCOMYCIN 2 SENSITIVE Sensitive      TRIMETH/SULFA 160 RESISTANT Resistant     CLINDAMYCIN >=8 RESISTANT Resistant     RIFAMPIN <=0.5 SENSITIVE Sensitive     Inducible Clindamycin NEGATIVE Sensitive     * FEW STAPHYLOCOCCUS SPECIES (COAGULASE NEGATIVE)  Aerobic/Anaerobic Culture (surgical/deep wound)     Status: None (Preliminary result)   Collection Time: 05/08/16  7:58 PM  Result Value Ref Range Status   Specimen Description WOUND L HAND SURG WND  Final   Special Requests PT ON ZOSYN  Final   Gram Stain   Final    RARE WBC PRESENT, PREDOMINANTLY MONONUCLEAR NO ORGANISMS SEEN    Culture PENDING  Incomplete   Report Status PENDING  Incomplete         Radiology Studies: Derrick Hand Left W Wo Contrast  05/08/2016  CLINICAL DATA:  Left hand pain and swelling. The patient developed an infection of the left hand approximately 4 weeks ago. Status post incision and drainage of the left hand times two, last on 05/01/2016. EXAM: MRI OF THE LEFT HAND WITHOUT AND  WITH CONTRAST TECHNIQUE: Multiplanar, multisequence Derrick imaging was performed both before and after administration of intravenous contrast. CONTRAST:  20mL MULTIHANCE GADOBENATE DIMEGLUMINE 529 MG/ML IV SOLN COMPARISON:  Plain films left hand 04/15/2016 FINDINGS: Subcutaneous edema and enhancement are identified over the dorsum of the hand. There is a skin defect at approximately the level of the distal third metacarpal most consistent with site of prior incision and drainage. A rim enhancing fluid collection in the subcutaneous tissues over the dorsum of the hand measures 0.9 cm long by 0.3 cm AP by 0.5 cm transverse. The collection is between the second and third metacarpals at the level of the metaphysis of these bones. No other findings to suggest abscess is identified. All intrinsic musculature of the hand demonstrates normal signal in there is no intramuscular fluid collection. Bone marrow signal is normal throughout. IMPRESSION: Findings most consistent with cellulitis of the  dorsal soft tissues of the left hand with a possible abscess measuring 0.9 x 0.3 x 0.5 cm between the distal metaphyses of the second and third metacarpals. There is no evidence of myositis or osteomyelitis. Electronically Signed   By: Drusilla Kanner M.D.   On: 05/08/2016 15:36        Scheduled Meds: . celecoxib  200 mg Oral BID  . enoxaparin (LOVENOX) injection  55 mg Subcutaneous Q24H  . gabapentin  900 mg Oral BID  . piperacillin-tazobactam  3.375 g Intravenous Q8H  . vancomycin  1,000 mg Intravenous Q8H  . vitamin C  1,000 mg Oral Daily   Continuous Infusions: . sodium chloride 100 mL/hr at 05/10/16 0816  . lactated ringers 100 mL/hr at 05/08/16 2218     LOS: 3 days    Time spent: 15 min    Jeralyn Bennett, MD [facility] Pager [phone]  If 7PM-7AM, please contact night-coverage [email or web address] Password [email or web address] 05/10/2016, 2:21 PM

## 2016-05-10 NOTE — Progress Notes (Addendum)
PT--HYDROTHERAPY EVAL AND TX  05/10/16 1300  Subjective Assessment  Subjective Pt agreeable to hydrotherapy.  Patient and Family Stated Goals Pain control and wound healing  Date of Onset 05/10/16  Prior Treatments Medication, I&D  Evaluation and Treatment  Evaluation and Treatment Procedures Explained to Patient/Family Yes  Wound / Incision (Open or Dehisced) 05/10/16 Incision - Open Hand Left open incisions after repeat I&D L hand  Date First Assessed/Time First Assessed: 05/10/16 1230   Wound Type: Incision - Open  Location: Hand  Location Orientation: Left  Wound Description (Comments): open incisions after repeat I&D L hand  Present on Admission: (c) Yes  Dressing Type Moist to dry;Gauze (Comment);Compression wrap  Dressing Changed Changed  Dressing Change Frequency Daily  Site / Wound Assessment Granulation tissue;Clean  % Wound base Red or Granulating 95%  % Wound base Yellow 5%  Peri-wound Assessment Intact;Erythema (blanchable);Edema  Wound Length (cm) (2 incisions:#1-- 4.5x2x1.5;     #2-- 3x2x0.2)  Drainage Amount Minimal  Drainage Description Serosanguineous  Treatment Cleansed;Hydrotherapy (Pulse lavage);Packing (Saline gauze)  Hydrotherapy  Pulsed lavage therapy - wound location dorsum L hand  Pulsed Lavage with Suction (psi) 4 psi  Pulsed Lavage with Suction - Normal Saline Used 1000 mL  Pulsed Lavage Tip Tip with splash shield  Wound Therapy - Assess/Plan/Recommendations  Wound Therapy - Clinical Statement Pt will benefit from continued hydrotherapy to promote wound bed healing and decrease bioburden.  Wound Therapy - Functional Problem List Decreased strength and AROM of LUE, and acute pain.   Factors Delaying/Impairing Wound Healing Infection - systemic/local  Hydrotherapy Plan Debridement;Dressing change;Patient/family education;Pulsatile lavage with suction  Wound Therapy - Frequency 6X / week  Wound Therapy - Follow Up Recommendations Other (comment) (dsg changes  per RN after D/C)  Wound Plan See above  Wound Therapy Goals - Improve the function of patient's integumentary system by progressing the wound(s) through the phases of wound healing by:  Decrease Necrotic Tissue to 0%  Decrease Necrotic Tissue - Progress Goal set today  Increase Granulation Tissue to 100%  Increase Granulation Tissue - Progress Goal set today

## 2016-05-11 DIAGNOSIS — F1910 Other psychoactive substance abuse, uncomplicated: Secondary | ICD-10-CM

## 2016-05-11 DIAGNOSIS — L02519 Cutaneous abscess of unspecified hand: Secondary | ICD-10-CM

## 2016-05-11 DIAGNOSIS — L03119 Cellulitis of unspecified part of limb: Secondary | ICD-10-CM

## 2016-05-11 LAB — CBC
HEMATOCRIT: 44.1 % (ref 39.0–52.0)
HEMOGLOBIN: 13.9 g/dL (ref 13.0–17.0)
MCH: 27.9 pg (ref 26.0–34.0)
MCHC: 31.5 g/dL (ref 30.0–36.0)
MCV: 88.4 fL (ref 78.0–100.0)
Platelets: 217 10*3/uL (ref 150–400)
RBC: 4.99 MIL/uL (ref 4.22–5.81)
RDW: 14 % (ref 11.5–15.5)
WBC: 6.5 10*3/uL (ref 4.0–10.5)

## 2016-05-11 NOTE — Progress Notes (Signed)
PROGRESS NOTE    Colon Rueth  ZOX:[id number] DOB: 09/30/1981 DOA: 05/07/2016 PCP: No primary care provider on file.   Brief Narrative:  Mr Willems is a 35 year old with history of polysubstance abuse, currently an inmate, admitted to the medicine service on 05/07/2016, presented as a transfer from Dr Merrilee Seashore office for further treatment of cellulitis and possible abscess of left hand. He recently underwent incision and drainage on 05/01/2016 of left hand abscess. Had been treated with doxycycline in the outpatient setting. On evaluation he had significant swelling and erythema over the dorsum of left hand. He was started on IV Zosyn and vancomycin.   Subjective: Seen with nursing staff and correctional facility officer at bedside. Still complaining about pain, although he looks very comfortable.  Assessment & Plan:   Active Problems:   Cellulitis and abscess of hand   Polysubstance abuse   Cellulitis/abscess of left hand -He presents with increasing pain, erythema, swelling of his left hand -He was started on broad-spectrum IV antibiotic therapy with IV vancomycin and Zosyn -Dr Merlyn Lot of orthopedic surgery aware of patient's hospitalization and will await further recommendations from him -MRI on 6/15 did not show myositis or osteomyelitis. -On 05/09/2016 he underwent incision and drainage of left hand abscess, procedure was performed by Dr. Merlyn Lot of orthopedic surgery. He tolerated procedure well.  -Intraoperative cultures showing no growth to date. -Orthopedic surgery recommending starting hydrotherapy -Remains on IV vancomycin and Zosyn  History of polysubstance abuse -Patient still on IV Dilaudid for cellulitis and postoperative pain control. -Reported that he has high tolerance to pain medications, I have asked him to decrease his pain medication consumption as he will be probably discharged in 1-2 days.   DVT prophylaxis: Lovenox Code Status: Full code Family  Communication: Family not available Disposition Plan: Anticipate discharge back to the correctional facility when medically stable  Consultants:   Orthopedic surgery   Antimicrobials:   Zosyn started on 05/07/2016  Vancomycin started on 05/07/2016   Objective: Filed Vitals:   05/10/16 0521 05/10/16 1355 05/10/16 2240 05/11/16 0526  BP: 121/76 139/80 130/83 131/78  Pulse: 65 69 66 56  Temp: 98.2 F (36.8 C) 97.9 F (36.6 C) 98.1 F (36.7 C) 97.6 F (36.4 C)  TempSrc: Oral Oral Oral Oral  Resp: 20 18 18 18   SpO2: 97% 99% 99% 97%    Intake/Output Summary (Last 24 hours) at 05/11/16 1128 Last data filed at 05/11/16 0100  Gross per 24 hour  Intake    480 ml  Output      0 ml  Net    480 ml   There were no vitals filed for this visit.  Examination:  General exam: He appears calmer, awake and alert Respiratory system: Clear to auscultation. Respiratory effort normal. Cardiovascular system: S1 & S2 heard, RRR. No JVD, murmurs, rubs, gallops or clicks. No pedal edema. Gastrointestinal system: Abdomen is nondistended, soft and nontender. No organomegaly or masses felt. Normal bowel sounds heard. Central nervous system: Alert and oriented. No focal neurological deficits. Extremities: left extremity is bandaged, status post incision and drainage. Did not remove bandages.  Psychiatry: Judgement and insight appear normal. Mood & affect appropriate.     Data Reviewed: I have personally reviewed following labs and imaging studies  CBC:  Recent Labs Lab 05/07/16 1859 05/08/16 0500 05/09/16 0501 05/10/16 0551 05/11/16 0519  WBC 7.3 6.8 8.3 7.9 6.5  HGB 15.5 13.6 13.0 14.1 13.9  HCT 47.6 42.1 41.0 44.8 44.1  MCV 86.7 86.1 86.7  88.7 88.4  PLT 264 237 209 214 217   Basic Metabolic Panel:  Recent Labs Lab 05/07/16 1859 05/08/16 0500 05/09/16 0501 05/10/16 0551  NA 138 138 138 139  K 4.1 3.8 4.0 3.9  CL 103 105 105 102  CO2 25 27 28 29   GLUCOSE 94 100* 112* 81   BUN 11 10 9 9   CREATININE 0.79 0.89 0.96 0.87  CALCIUM 9.4 8.8* 8.7* 9.0   GFR: Estimated Creatinine Clearance: 146.5 mL/min (by C-G formula based on Cr of 0.87). Liver Function Tests:  Recent Labs Lab 05/07/16 1859  AST 21  ALT 23  ALKPHOS 63  BILITOT 0.5  PROT 7.5  ALBUMIN 4.6   No results for input(s): LIPASE, AMYLASE in the last 168 hours. No results for input(s): AMMONIA in the last 168 hours. Coagulation Profile: No results for input(s): INR, PROTIME in the last 168 hours. Cardiac Enzymes: No results for input(s): CKTOTAL, CKMB, CKMBINDEX, TROPONINI in the last 168 hours. BNP (last 3 results) No results for input(s): PROBNP in the last 8760 hours. HbA1C: No results for input(s): HGBA1C in the last 72 hours. CBG: No results for input(s): GLUCAP in the last 168 hours. Lipid Profile: No results for input(s): CHOL, HDL, LDLCALC, TRIG, CHOLHDL, LDLDIRECT in the last 72 hours. Thyroid Function Tests: No results for input(s): TSH, T4TOTAL, FREET4, T3FREE, THYROIDAB in the last 72 hours. Anemia Panel: No results for input(s): VITAMINB12, FOLATE, FERRITIN, TIBC, IRON, RETICCTPCT in the last 72 hours. Sepsis Labs:  Recent Labs Lab 05/07/16 1859  LATICACIDVEN 1.0    Recent Results (from the past 240 hour(s))  Aerobic Culture (superficial specimen)     Status: None   Collection Time: 05/01/16  6:12 PM  Result Value Ref Range Status   Specimen Description WOUND HAND  Final   Special Requests NONE  Final   Gram Stain   Final    MODERATE WBC PRESENT, PREDOMINANTLY MONONUCLEAR NO ORGANISMS SEEN    Culture   Final    FEW STAPHYLOCOCCUS SPECIES (COAGULASE NEGATIVE) CRITICAL RESULT CALLED TO, READ BACK BY AND VERIFIED WITH: L INMAN,RN AT 1517 05/02/16 BY L BENFIELD    Report Status 05/03/2016 FINAL  Final   Organism ID, Bacteria STAPHYLOCOCCUS SPECIES (COAGULASE NEGATIVE)  Final      Susceptibility   Staphylococcus species (coagulase negative) - MIC*    CIPROFLOXACIN  4 RESISTANT Resistant     ERYTHROMYCIN >=8 RESISTANT Resistant     GENTAMICIN 8 INTERMEDIATE Intermediate     OXACILLIN >=4 RESISTANT Resistant     TETRACYCLINE <=1 SENSITIVE Sensitive     VANCOMYCIN 2 SENSITIVE Sensitive     TRIMETH/SULFA 160 RESISTANT Resistant     CLINDAMYCIN >=8 RESISTANT Resistant     RIFAMPIN <=0.5 SENSITIVE Sensitive     Inducible Clindamycin NEGATIVE Sensitive     * FEW STAPHYLOCOCCUS SPECIES (COAGULASE NEGATIVE)  Aerobic/Anaerobic Culture (surgical/deep wound)     Status: None (Preliminary result)   Collection Time: 05/08/16  7:58 PM  Result Value Ref Range Status   Specimen Description WOUND L HAND SURG WND  Final   Special Requests PT ON ZOSYN  Final   Gram Stain   Final    RARE WBC PRESENT, PREDOMINANTLY MONONUCLEAR NO ORGANISMS SEEN    Culture   Final    NO GROWTH 1 DAY [facility]    Report Status PENDING  Incomplete         Radiology Studies: No results found.  Scheduled Meds: . celecoxib  200 mg Oral BID  . enoxaparin (LOVENOX) injection  55 mg Subcutaneous Q24H  . gabapentin  900 mg Oral BID  . piperacillin-tazobactam  3.375 g Intravenous Q8H  . vancomycin  1,250 mg Intravenous Q8H  . vitamin C  1,000 mg Oral Daily   Continuous Infusions: . sodium chloride 100 mL/hr at 05/10/16 1941  . lactated ringers 100 mL/hr at 05/08/16 2218     LOS: 4 days    Time spent: 15 min    Kort Stettler A, MD [facility] Pager [phone]  If 7PM-7AM, please contact night-coverage [email or web address] Password [facility] 05/11/2016, 11:28 AM

## 2016-05-12 MED ORDER — CEPHALEXIN 500 MG PO CAPS: 500.0000 mg | ORAL_CAPSULE | Freq: Three times a day (TID) | ORAL | Status: DC

## 2016-05-12 MED ORDER — OXYCODONE-ACETAMINOPHEN 5-325 MG PO TABS: 1.0000 | ORAL_TABLET | Freq: Four times a day (QID) | ORAL | Status: DC | PRN

## 2016-05-12 MED ORDER — DOXYCYCLINE HYCLATE 100 MG PO CAPS: 100.0000 mg | ORAL_CAPSULE | Freq: Two times a day (BID) | ORAL | Status: DC

## 2016-05-12 NOTE — Progress Notes (Signed)
PT HYDROTHERAPY TREATMENT NOTE  05/12/16 1100  Subjective Assessment  Subjective Pt agreeable to hydrotherapy.  Patient and Family Stated Goals Pain control and wound healing  Date of Onset 05/10/16  Prior Treatments Medication, I&D  Evaluation and Treatment  Evaluation and Treatment Procedures Explained to Patient/Family Yes  Evaluation and Treatment Procedures agreed to  Wound / Incision (Open or Dehisced) 05/10/16 Incision - Open Hand Left open incisions after repeat I&D L hand  Date First Assessed/Time First Assessed: 05/10/16 1230   Wound Type: Incision - Open  Location: Hand  Location Orientation: Left  Wound Description (Comments): open incisions after repeat I&D L hand  Present on Admission: (c) Yes  Dressing Type Moist to dry;Gauze (Comment);Compression wrap  Dressing Change Frequency Daily  % Wound base Red or Granulating 90%  % Wound base Yellow 10% (both wounds)  Peri-wound Assessment Maceration;Erythema (blanchable);Edema (mild maceration, edema improved)  Wound Length (cm) (see eval)  Drainage Amount Minimal  Drainage Description Serosanguineous  Treatment Hydrotherapy (Pulse lavage);Packing (Saline gauze);Debridement (Selective)  Hydrotherapy  Pulsed lavage therapy - wound location dorsum L hand  Pulsed Lavage with Suction (psi) 4 psi  Pulsed Lavage with Suction - Normal Saline Used 1000 mL  Pulsed Lavage Tip Tip with splash shield  Selective Debridement  Selective Debridement - Location both incisions L hand  Selective Debridement - Tools Used Forceps  Selective Debridement - Tissue Removed yellow slough  Wound Therapy - Assess/Plan/Recommendations  Wound Therapy - Clinical Statement Pt will benefit from continued hydrotherapy to promote wound bed healing and decrease bioburden.  Wound Therapy - Functional Problem List Decreased strength and AROM of LUE, and acute pain.   Factors Delaying/Impairing Wound Healing Infection - systemic/local  Hydrotherapy Plan  Debridement;Dressing change;Patient/family education;Pulsatile lavage with suction  Wound Therapy - Frequency 6X / week  Wound Therapy - Follow Up Recommendations (will need dsg changes at facility per RN)  Wound Plan See above  Wound Therapy Goals - Improve the function of patient's integumentary system by progressing the wound(s) through the phases of wound healing by:  Decrease Necrotic Tissue to 0%  Decrease Necrotic Tissue - Progress Progressing toward goal  Increase Granulation Tissue to 100%  Increase Granulation Tissue - Progress Progressing toward goal  Time For Goal Achievement 7 days  Wound Therapy - Potential for Goals Excellent

## 2016-05-12 NOTE — Discharge Summary (Signed)
Physician Discharge Summary  Derrick Rivers Rivers EPP:[id number] DOB: [birth year] DOA: 05/07/2016  PCP: No primary care provider on file.  Admit date: 05/07/2016 Discharge date: 05/12/2016  Admitted From: Prison Disposition:  Back to the prison.  Recommendations for Outpatient Follow-up:  1. Follow up with PCP in 1-2 weeks 2. Please obtain BMP/CBC in one week 3. Please follow up on the following pending results:  Home Health: None Equipment/Devices: None  Discharge Condition: Stable CODE STATUS:FULL Diet recommendation: Regular  Brief/Interim Summary: Derrick Rivers Rivers is a 35 y.o. male with medical history significant of polysubstance abuse, currently inmate, presented from Dr. Merrilee SeashoreKuzma's office for worsening left 10 pain swelling erythema. Derrick Rivers Rivers reporting that he developed of infection over the dorsal aspect of his left hand about 3 weeks ago, coming progressively worse. This progressed to a left hand abscess for which Dr. Merlyn LotKuzma of orthopedic surgery performed incision and drainage on 05/01/2016. Over the past 3 days his left hand has become increasingly painful having significant erythema swelling. He states unable to flex or extend his fingers. He saw Dr. Merlyn LotKuzma today in the office he recommended readmitted to the hospital for further evaluation and treatment. Derrick Rivers. Derrick Rivers Rivers reports associated fevers, chills, nausea without vomiting.   Discharge Diagnoses:  Active Problems:   Cellulitis and abscess of hand   Polysubstance abuse   Cellulitis/abscess of left hand -He presents with increasing pain, erythema, swelling of his left hand -He was started on broad-spectrum IV antibiotic therapy with IV vancomycin and Zosyn -Dr Merlyn LotKuzma of orthopedic surgery aware of patient's hospitalization and will await further recommendations from him -MRI on 6/15 did not show myositis or osteomyelitis. -On 05/09/2016 he underwent incision and drainage of left hand abscess, procedure was performed by Dr. Merlyn LotKuzma of  orthopedic surgery. He tolerated procedure well.  -Intraoperative cultures showing no growth to date. -Orthopedic surgery recommending starting hydrotherapy -Initially was on IV Zosyn and vancomycin, and discharged home on doxycycline and Keflex for 7 more days. -Patient to follow with Dr. Merlyn LotKuzma for hydrotherapy and wound dressings.  History of polysubstance abuse -Patient still on IV Dilaudid for cellulitis and postoperative pain control. -Reported that he has high tolerance to pain medications, discharged back to prison on Percocet only 20 prescribed  Discharge Instructions  Discharge Instructions    Diet - low sodium heart healthy    Complete by:  As directed      Increase activity slowly    Complete by:  As directed             Medication List    TAKE these medications        cephALEXin 500 MG capsule  Commonly known as:  KEFLEX  Take 1 capsule (500 mg total) by mouth 3 (three) times daily.     doxycycline 100 MG capsule  Commonly known as:  VIBRAMYCIN  Take 1 capsule (100 mg total) by mouth every 12 (twelve) hours.     gabapentin 300 MG capsule  Commonly known as:  NEURONTIN  Take 900 mg by mouth 2 (two) times daily.     ibuprofen 600 MG tablet  Commonly known as:  ADVIL,MOTRIN  Take 600 mg by mouth every 6 (six) hours as needed for moderate pain.     oxyCODONE-acetaminophen 5-325 MG tablet  Commonly known as:  PERCOCET  Take 1 tablet by mouth every 6 (six) hours as needed for severe pain.       Follow-up Information    Call Tami RibasKUZMA,KEVIN R, MD.   Specialty:  Orthopedic  Surgery   Contact information:   [address] [place] [address] [phone]      Allergies  Allergen Reactions  . Multihance [Gadobenate] Nausea And Vomiting    Consultations:  Dr. Merlyn Lot Hand surgery   Procedures/Studies: Dg Forearm Left  04/15/2016  CLINICAL DATA:  Swelling in the forearm. EXAM: LEFT FOREARM - 2 VIEW COMPARISON:  None. FINDINGS: The abnormal swelling in  the dorsum of the hand and wrist continues along the dorsum and radial side of the forearm, where there is infiltration of the subcutaneous tissues along with soft tissue thickening. No foreign body observed. No underlying bony abnormality. IMPRESSION: 1. Abnormal edema tracking in the dorsum and radial side of the forearm. Cellulitis is not excluded. No gas in the soft tissues noted. Electronically Signed   By: Gaylyn Rong M.D.   On: 04/15/2016 18:15   Derrick Rivers Hand Left W Wo Contrast  05/08/2016  CLINICAL DATA:  Left hand pain and swelling. The patient developed an infection of the left hand approximately 4 weeks ago. Status post incision and drainage of the left hand times two, last on 05/01/2016. EXAM: MRI OF THE LEFT HAND WITHOUT AND WITH CONTRAST TECHNIQUE: Multiplanar, multisequence Derrick Rivers imaging was performed both before and after administration of intravenous contrast. CONTRAST:  20mL MULTIHANCE GADOBENATE DIMEGLUMINE 529 MG/ML IV SOLN COMPARISON:  Plain films left hand 04/15/2016 FINDINGS: Subcutaneous edema and enhancement are identified over the dorsum of the hand. There is a skin defect at approximately the level of the distal third metacarpal most consistent with site of prior incision and drainage. A rim enhancing fluid collection in the subcutaneous tissues over the dorsum of the hand measures 0.9 cm long by 0.3 cm AP by 0.5 cm transverse. The collection is between the second and third metacarpals at the level of the metaphysis of these bones. No other findings to suggest abscess is identified. All intrinsic musculature of the hand demonstrates normal signal in there is no intramuscular fluid collection. Bone marrow signal is normal throughout. IMPRESSION: Findings most consistent with cellulitis of the dorsal soft tissues of the left hand with a possible abscess measuring 0.9 x 0.3 x 0.5 cm between the distal metaphyses of the second and third metacarpals. There is no evidence of myositis or  osteomyelitis. Electronically Signed   By: Drusilla Kanner M.D.   On: 05/08/2016 15:36   Dg Hand Complete Left  04/15/2016  CLINICAL DATA:  Swelling and pain in the left hand beginning today. Tightness extending up to the elbow. Swelling. History of cellulitis. EXAM: LEFT HAND - COMPLETE 3+ VIEW COMPARISON:  None. FINDINGS: The patient flexed his fingers during imaging, reducing diagnostic sensitivity and specificity in assessing the fingers. I do not observe a foreign body. No bony destructive findings or fracture observed. There is extensive soft tissue swelling along the dorsum of the hand without gas tracking in the soft tissues. This tracks into the dorsum of the wrist as well. Lateral sclerosis in the distal phalanx of the thumb, likely incidental. IMPRESSION: 1. Prominent dorsal soft tissue swelling in the hand and wrist, without underlying bony abnormality or visible foreign body. Electronically Signed   By: Gaylyn Rong M.D.   On: 04/15/2016 18:14    (Echo, Carotid, EGD, Colonoscopy, ERCP)   Subjective:  Discharge Exam:  Filed Vitals:   05/11/16 2031 05/12/16 1000 05/12/16 1244 05/12/16 1344  BP: 131/72 141/64  120/68  Pulse: 62 55  63  Temp: 97.8 F (36.6 C) 98.3 F (36.8 C)  98 F (36.7 C)  TempSrc: Oral Oral  Oral  Resp: 18   18  Height:   5\' 9"  (1.753 m)   SpO2: 98%   97%   General: Pt is alert, awake, not in acute distress Cardiovascular: RRR, S1/S2 +, no rubs, no gallops Respiratory: CTA bilaterally, no wheezing, no rhonchi Abdominal: Soft, NT, ND, bowel sounds + Extremities: no edema, no cyanosis    The results of significant diagnostics from this hospitalization (including imaging, microbiology, ancillary and laboratory) are listed below for reference.     Microbiology: Recent Results (from the past 240 hour(s))  Aerobic/Anaerobic Culture (surgical/deep wound)     Status: None (Preliminary result)   Collection Time: 05/08/16  7:58 PM  Result Value Ref  Range Status   Specimen Description WOUND L HAND SURG WND  Final   Special Requests PT ON ZOSYN  Final   Gram Stain   Final    RARE WBC PRESENT, PREDOMINANTLY MONONUCLEAR NO ORGANISMS SEEN    Culture   Final    NO GROWTH 2 DAYS NO ANAEROBES ISOLATED; CULTURE IN PROGRESS FOR 5 DAYS Performed at [facility]    Report Status PENDING  Incomplete     Labs: BNP (last 3 results) No results for input(s): BNP in the last 8760 hours. Basic Metabolic Panel:  Recent Labs Lab 05/07/16 1859 05/08/16 0500 05/09/16 0501 05/10/16 0551  NA 138 138 138 139  K 4.1 3.8 4.0 3.9  CL 103 105 105 102  CO2 25 27 28 29   GLUCOSE 94 100* 112* 81  BUN 11 10 9 9   CREATININE 0.79 0.89 0.96 0.87  CALCIUM 9.4 8.8* 8.7* 9.0   Liver Function Tests:  Recent Labs Lab 05/07/16 1859  AST 21  ALT 23  ALKPHOS 63  BILITOT 0.5  PROT 7.5  ALBUMIN 4.6   No results for input(s): LIPASE, AMYLASE in the last 168 hours. No results for input(s): AMMONIA in the last 168 hours. CBC:  Recent Labs Lab 05/07/16 1859 05/08/16 0500 05/09/16 0501 05/10/16 0551 05/11/16 0519  WBC 7.3 6.8 8.3 7.9 6.5  HGB 15.5 13.6 13.0 14.1 13.9  HCT 47.6 42.1 41.0 44.8 44.1  MCV 86.7 86.1 86.7 88.7 88.4  PLT 264 237 209 214 217   Cardiac Enzymes: No results for input(s): CKTOTAL, CKMB, CKMBINDEX, TROPONINI in the last 168 hours. BNP: Invalid input(s): POCBNP CBG: No results for input(s): GLUCAP in the last 168 hours. D-Dimer No results for input(s): DDIMER in the last 72 hours. Hgb A1c No results for input(s): HGBA1C in the last 72 hours. Lipid Profile No results for input(s): CHOL, HDL, LDLCALC, TRIG, CHOLHDL, LDLDIRECT in the last 72 hours. Thyroid function studies No results for input(s): TSH, T4TOTAL, T3FREE, THYROIDAB in the last 72 hours.  Invalid input(s): FREET3 Anemia work up No results for input(s): VITAMINB12, FOLATE, FERRITIN, TIBC, IRON, RETICCTPCT in the last 72 hours. Urinalysis No  results found for: COLORURINE, APPEARANCEUR, LABSPEC, PHURINE, GLUCOSEU, HGBUR, BILIRUBINUR, KETONESUR, PROTEINUR, UROBILINOGEN, NITRITE, LEUKOCYTESUR Sepsis Labs Invalid input(s): PROCALCITONIN,  WBC,  LACTICIDVEN Microbiology Recent Results (from the past 240 hour(s))  Aerobic/Anaerobic Culture (surgical/deep wound)     Status: None (Preliminary result)   Collection Time: 05/08/16  7:58 PM  Result Value Ref Range Status   Specimen Description WOUND L HAND SURG WND  Final   Special Requests PT ON ZOSYN  Final   Gram Stain   Final    RARE WBC PRESENT, PREDOMINANTLY MONONUCLEAR NO ORGANISMS SEEN  Culture   Final    NO GROWTH 2 DAYS NO ANAEROBES ISOLATED; CULTURE IN PROGRESS FOR 5 DAYS Performed at [facility]    Report Status PENDING  Incomplete     Time coordinating discharge: Over 30 minutes  SIGNED:   Clint Lipps, MD  [facility] 05/12/2016, 3:15 PM Pager   If 7PM-7AM, please contact night-coverage [email or web address] Password [email or web address]

## 2016-05-12 NOTE — Progress Notes (Signed)
Pt very demanding about getting his pain meds almost to the minute it is due.  If it was five minutes late he would act irritated and ask me "didn't I know when it was due".  I  overheard pt telling the deputy that is was time for "his dope", meaning his pain meds.. He asked me to follow IV dilaudid with a push of saline.  When I didn't, he complained that "he could not feel it working". I informed him the NS maintance fluid would flush it through.

## 2016-05-14 LAB — AEROBIC/ANAEROBIC CULTURE (SURGICAL/DEEP WOUND): Culture: NO GROWTH

## 2016-05-14 LAB — AEROBIC/ANAEROBIC CULTURE W GRAM STAIN (SURGICAL/DEEP WOUND)

## 2016-05-26 ENCOUNTER — Encounter (HOSPITAL_COMMUNITY): Payer: Self-pay | Admitting: Internal Medicine

## 2016-05-26 ENCOUNTER — Telehealth (HOSPITAL_BASED_OUTPATIENT_CLINIC_OR_DEPARTMENT_OTHER): Payer: Self-pay | Admitting: Emergency Medicine

## 2016-05-26 ENCOUNTER — Observation Stay (HOSPITAL_COMMUNITY)

## 2016-05-26 ENCOUNTER — Inpatient Hospital Stay (HOSPITAL_COMMUNITY)
Admission: AD | Admit: 2016-05-26 | Discharge: 2016-06-02 | DRG: 580 | Source: Ambulatory Visit | Attending: Internal Medicine | Admitting: Internal Medicine

## 2016-05-26 DIAGNOSIS — F1910 Other psychoactive substance abuse, uncomplicated: Secondary | ICD-10-CM | POA: Diagnosis present

## 2016-05-26 DIAGNOSIS — L02512 Cutaneous abscess of left hand: Principal | ICD-10-CM | POA: Diagnosis present

## 2016-05-26 DIAGNOSIS — Z6839 Body mass index (BMI) 39.0-39.9, adult: Secondary | ICD-10-CM

## 2016-05-26 DIAGNOSIS — F17200 Nicotine dependence, unspecified, uncomplicated: Secondary | ICD-10-CM | POA: Diagnosis present

## 2016-05-26 DIAGNOSIS — I10 Essential (primary) hypertension: Secondary | ICD-10-CM | POA: Diagnosis not present

## 2016-05-26 DIAGNOSIS — R197 Diarrhea, unspecified: Secondary | ICD-10-CM | POA: Diagnosis not present

## 2016-05-26 DIAGNOSIS — L03114 Cellulitis of left upper limb: Secondary | ICD-10-CM | POA: Diagnosis not present

## 2016-05-26 DIAGNOSIS — F17210 Nicotine dependence, cigarettes, uncomplicated: Secondary | ICD-10-CM | POA: Diagnosis present

## 2016-05-26 DIAGNOSIS — A047 Enterocolitis due to Clostridium difficile: Secondary | ICD-10-CM | POA: Diagnosis present

## 2016-05-26 DIAGNOSIS — L0390 Cellulitis, unspecified: Secondary | ICD-10-CM | POA: Insufficient documentation

## 2016-05-26 HISTORY — DX: Other chronic pain: G89.29

## 2016-05-26 HISTORY — DX: Low back pain, unspecified: M54.50

## 2016-05-26 HISTORY — DX: Other psychoactive substance abuse, uncomplicated: F19.10

## 2016-05-26 HISTORY — DX: Cellulitis of left upper limb: L03.114

## 2016-05-26 HISTORY — DX: Low back pain: M54.5

## 2016-05-26 HISTORY — DX: Cutaneous abscess of left hand: L02.512

## 2016-05-26 LAB — CBC WITH DIFFERENTIAL/PLATELET
BASOS ABS: 0 10*3/uL (ref 0.0–0.1)
BASOS PCT: 0 %
Eosinophils Absolute: 0.1 10*3/uL (ref 0.0–0.7)
Eosinophils Relative: 1 %
HEMATOCRIT: 49.6 % (ref 39.0–52.0)
HEMOGLOBIN: 15.6 g/dL (ref 13.0–17.0)
Lymphocytes Relative: 36 %
Lymphs Abs: 3.5 10*3/uL (ref 0.7–4.0)
MCH: 28.5 pg (ref 26.0–34.0)
MCHC: 31.5 g/dL (ref 30.0–36.0)
MCV: 90.7 fL (ref 78.0–100.0)
MONO ABS: 0.5 10*3/uL (ref 0.1–1.0)
Monocytes Relative: 6 %
NEUTROS ABS: 5.5 10*3/uL (ref 1.7–7.7)
NEUTROS PCT: 57 %
Platelets: 253 10*3/uL (ref 150–400)
RBC: 5.47 MIL/uL (ref 4.22–5.81)
RDW: 14.5 % (ref 11.5–15.5)
WBC: 9.7 10*3/uL (ref 4.0–10.5)

## 2016-05-26 LAB — COMPREHENSIVE METABOLIC PANEL
ALBUMIN: 4.3 g/dL (ref 3.5–5.0)
ALT: 62 U/L (ref 17–63)
ANION GAP: 9 (ref 5–15)
AST: 33 U/L (ref 15–41)
Alkaline Phosphatase: 62 U/L (ref 38–126)
BUN: 6 mg/dL (ref 6–20)
CHLORIDE: 105 mmol/L (ref 101–111)
CO2: 24 mmol/L (ref 22–32)
Calcium: 9.5 mg/dL (ref 8.9–10.3)
Creatinine, Ser: 0.89 mg/dL (ref 0.61–1.24)
GFR calc Af Amer: >60 mL/min (ref >60)
GFR calc non Af Amer: >60 mL/min (ref >60)
GLUCOSE: 98 mg/dL (ref 65–99)
POTASSIUM: 3.9 mmol/L (ref 3.5–5.1)
SODIUM: 138 mmol/L (ref 135–145)
Total Bilirubin: 0.7 mg/dL (ref 0.3–1.2)
Total Protein: 7.1 g/dL (ref 6.5–8.1)

## 2016-05-26 LAB — APTT: aPTT: 31 seconds (ref 24–37)

## 2016-05-26 LAB — C DIFFICILE QUICK SCREEN W PCR REFLEX
C DIFFICILE (CDIFF) TOXIN: NEGATIVE
C Diff antigen: POSITIVE — AB

## 2016-05-26 LAB — PROTIME-INR
INR: 1.07 (ref 0.00–1.49)
PROTHROMBIN TIME: 14.1 s (ref 11.6–15.2)

## 2016-05-26 LAB — MRSA PCR SCREENING: MRSA by PCR: NEGATIVE

## 2016-05-26 MED ORDER — HYDRALAZINE HCL 20 MG/ML IJ SOLN: 5.0000 mg | INTRAMUSCULAR | Status: DC | PRN

## 2016-05-26 MED ORDER — OXYCODONE-ACETAMINOPHEN 5-325 MG PO TABS: 2.0000 | ORAL_TABLET | Freq: Four times a day (QID) | ORAL | Status: DC | PRN

## 2016-05-26 MED ORDER — VANCOMYCIN HCL 10 G IV SOLR
2000.0000 mg | Freq: Once | INTRAVENOUS | Status: AC
  Administered 2016-05-26: 2000 mg via INTRAVENOUS
  Filled 2016-05-26: qty 2000

## 2016-05-26 MED ORDER — HYDROMORPHONE HCL 1 MG/ML IJ SOLN
0.5000 mg | Freq: Once | INTRAMUSCULAR | Status: AC
  Administered 2016-05-26: 0.5 mg via INTRAVENOUS
  Filled 2016-05-26: qty 1

## 2016-05-26 MED ORDER — HYDROMORPHONE HCL 1 MG/ML IJ SOLN
1.0000 mg | Freq: Once | INTRAMUSCULAR | Status: AC
  Administered 2016-05-26: 1 mg via INTRAVENOUS
  Filled 2016-05-26: qty 1

## 2016-05-26 MED ORDER — ALBUTEROL SULFATE (2.5 MG/3ML) 0.083% IN NEBU
2.5000 mg | INHALATION_SOLUTION | Freq: Four times a day (QID) | RESPIRATORY_TRACT | Status: DC | PRN
  Administered 2016-05-27: 2.5 mg via RESPIRATORY_TRACT
  Filled 2016-05-26: qty 3

## 2016-05-26 MED ORDER — ACETAMINOPHEN 650 MG RE SUPP: 650.0000 mg | Freq: Four times a day (QID) | RECTAL | Status: DC | PRN

## 2016-05-26 MED ORDER — HYDROMORPHONE HCL 1 MG/ML IJ SOLN
1.0000 mg | INTRAMUSCULAR | Status: DC | PRN
  Administered 2016-05-26 – 2016-05-27 (×3): 1 mg via INTRAVENOUS
  Filled 2016-05-26 (×4): qty 1

## 2016-05-26 MED ORDER — OXYCODONE-ACETAMINOPHEN 5-325 MG PO TABS: 1.0000 | ORAL_TABLET | ORAL | Status: DC | PRN

## 2016-05-26 MED ORDER — ACETAMINOPHEN 325 MG PO TABS
650.0000 mg | ORAL_TABLET | Freq: Four times a day (QID) | ORAL | Status: DC | PRN
  Administered 2016-05-28 – 2016-05-29 (×2): 650 mg via ORAL
  Filled 2016-05-26 (×2): qty 2

## 2016-05-26 MED ORDER — ENOXAPARIN SODIUM 40 MG/0.4ML ~~LOC~~ SOLN
40.0000 mg | SUBCUTANEOUS | Status: DC
  Administered 2016-05-26: 40 mg via SUBCUTANEOUS
  Filled 2016-05-26: qty 0.4

## 2016-05-26 MED ORDER — ONDANSETRON HCL 4 MG/2ML IJ SOLN: 4.0000 mg | Freq: Four times a day (QID) | INTRAMUSCULAR | Status: DC | PRN

## 2016-05-26 MED ORDER — GABAPENTIN 300 MG PO CAPS
900.0000 mg | ORAL_CAPSULE | Freq: Three times a day (TID) | ORAL | Status: DC
  Administered 2016-05-26 – 2016-05-30 (×12): 900 mg via ORAL
  Filled 2016-05-26 (×12): qty 3

## 2016-05-26 MED ORDER — HYDROMORPHONE HCL 1 MG/ML IJ SOLN: 0.5000 mg | INTRAMUSCULAR | Status: DC | PRN

## 2016-05-26 MED ORDER — OXYCODONE-ACETAMINOPHEN 5-325 MG PO TABS: 2.0000 | ORAL_TABLET | ORAL | Status: DC | PRN

## 2016-05-26 MED ORDER — OXYCODONE HCL 5 MG PO TABS
10.0000 mg | ORAL_TABLET | ORAL | Status: DC | PRN
  Administered 2016-05-26 – 2016-05-29 (×15): 10 mg via ORAL
  Filled 2016-05-26 (×15): qty 2

## 2016-05-26 MED ORDER — ONDANSETRON HCL 4 MG PO TABS: 4.0000 mg | ORAL_TABLET | Freq: Four times a day (QID) | ORAL | Status: DC | PRN

## 2016-05-26 MED ORDER — ALBUTEROL SULFATE (2.5 MG/3ML) 0.083% IN NEBU
2.5000 mg | INHALATION_SOLUTION | Freq: Four times a day (QID) | RESPIRATORY_TRACT | Status: DC
  Administered 2016-05-26: 2.5 mg via RESPIRATORY_TRACT
  Filled 2016-05-26: qty 3

## 2016-05-26 MED ORDER — ALBUTEROL SULFATE (2.5 MG/3ML) 0.083% IN NEBU
2.5000 mg | INHALATION_SOLUTION | Freq: Two times a day (BID) | RESPIRATORY_TRACT | Status: DC
  Administered 2016-05-27 – 2016-05-29 (×5): 2.5 mg via RESPIRATORY_TRACT
  Filled 2016-05-26 (×5): qty 3

## 2016-05-26 MED ORDER — PNEUMOCOCCAL VAC POLYVALENT 25 MCG/0.5ML IJ INJ
0.5000 mL | INJECTION | INTRAMUSCULAR | Status: AC
  Administered 2016-05-27: 0.5 mL via INTRAMUSCULAR
  Filled 2016-05-26: qty 0.5

## 2016-05-26 MED ORDER — SODIUM CHLORIDE 0.9 % IV SOLN
INTRAVENOUS | Status: DC
  Administered 2016-05-26: 18:00:00 via INTRAVENOUS

## 2016-05-26 MED ORDER — VANCOMYCIN HCL IN DEXTROSE 1-5 GM/200ML-% IV SOLN
1000.0000 mg | Freq: Three times a day (TID) | INTRAVENOUS | Status: DC
  Filled 2016-05-26 (×2): qty 200

## 2016-05-26 MED ORDER — VANCOMYCIN HCL IN DEXTROSE 1-5 GM/200ML-% IV SOLN
1000.0000 mg | Freq: Three times a day (TID) | INTRAVENOUS | Status: DC
  Administered 2016-05-27 (×2): 1000 mg via INTRAVENOUS
  Filled 2016-05-26 (×3): qty 200

## 2016-05-26 NOTE — Progress Notes (Signed)
Pharmacy Antibiotic Note  Derrick Rivers is a 35 y.o. male admitted on 05/26/2016 with cellulitis.  Pharmacy has been consulted for vancomycin dosing. Presents with worsening of left hand abscess s/p I+D 05/01/16. Currently afebrile - BMET and CBC pending. SCr from 05/10/16 0.87, wt 112.5kg taken 05/01/16.   Plan: -Vancomycin 2g x1 followed by 1g q8h -F/U cultures -Monitor renal fcn, clinical progress   Temp (24hrs), Avg:98.1 F (36.7 C), Min:98.1 F (36.7 C), Max:98.1 F (36.7 C)  No results for input(s): WBC, CREATININE, LATICACIDVEN, VANCOTROUGH, VANCOPEAK, VANCORANDOM, GENTTROUGH, GENTPEAK, GENTRANDOM, TOBRATROUGH, TOBRAPEAK, TOBRARND, AMIKACINPEAK, AMIKACINTROU, AMIKACIN in the last 168 hours.  CrCl cannot be calculated (Unknown ideal weight.).    Allergies  Allergen Reactions  . Multihance [Gadobenate] Nausea And Vomiting    Questionable, per patient    Antimicrobials this admission: Vancomycin 7/3>> Thank you for allowing pharmacy to be a part of this patient's care.  Sherle Poeob Vincent, PharmD Clinical Pharmacist  5:23 PM, 05/26/2016

## 2016-05-26 NOTE — H&P (Signed)
History and Physical    Derrick Rivers ZOX:[id number] DOB: [birth year] DOA: 05/26/2016  PCP: Pcp Not In System currently incarcerated Patient coming from: facility/jail/dr Merlyn Lot office  Chief Complaint: worsening pain/swelling left hand  HPI: Derrick Rivers is a 35 y.o. male with medical history significant for polysubstance abuse, recurrent abscess/cellulitis of left upper extremity, presents to [facility] [address] through 9 from Dr Merrilee Seashore office chief complaint of worsening pain/swelling of the left hand.  Pt being admitted for IV antibiotics and ID consult.  Information is obtained from the patient and the chart. Patient reports persistent wounds from previous abscess on left hand were "healing well" and the pain was "completely gone" until 2 days ago. He reports 2 days ago he gradually developed swelling erythema and worsening pain. Associated symptoms include subjective fever diaphoresis nausea without vomiting and 7 episodes of diarrhea over the last 36 hours. Addition range of motion 2 fingers and hand have decreased. He denies any chest pain palpitation shortness of breath headache dizziness syncope or near-syncope. He denies any dysuria hematuria frequency or urgency. He reports being in the hospital last month and underwent I & D at that time.   Of note the guard from facility reports that patient only "pretended" to take antibiotics when informed that his pain medicine was going to be weaned. Documentation from facility reviewed. No progress note.    ED Course: Direct admit  Review of Systems: As per HPI otherwise 10 point review of systems negative.   Ambulatory Status: Independent with steady gait  Past Medical History  Diagnosis Date  . Polysubstance abuse   . Cellulitis of left upper extremity   . Abscess of left hand     Past Surgical History  Procedure Laterality Date  . Fasciotomy    . I&d extremity Left 04/15/2016    Procedure: IRRIGATION AND DEBRIDEMENT LEFT HAND AND  FOREARM;   Surgeon: Betha Loa, MD;  Location: [facility] OR;  Service: Orthopedics;  Laterality: Left;  . I&d extremity Left 05/01/2016    Procedure: IRRIGATION AND DEBRIDEMENT EXTREMITY;  Surgeon: Betha Loa, MD;  Location: [facility];  Service: Orthopedics;  Laterality: Left;  I & D Left Hand   . I&d extremity Left 05/08/2016    Procedure: IRRIGATION AND DEBRIDEMENT, FASCIOTOMY LEFT HAND;  Surgeon: Betha Loa, MD;  Location: [facility];  Service: Orthopedics;  Laterality: Left;    Social History   Social History  . Marital Status: Single    Spouse Name: N/A  . Number of Children: N/A  . Years of Education: N/A   Occupational History  . Not on file.   Social History Main Topics  . Smoking status: Current Some Day Smoker  . Smokeless tobacco: Not on file  . Alcohol Use: No  . Drug Use: No  . Sexual Activity: Not on file   Other Topics Concern  . Not on file   Social History Narrative    Allergies  Allergen Reactions  . Multihance [Gadobenate] Nausea And Vomiting    Questionable, per patient    Family History  Problem Relation Age of Onset  . Hypertension Mother     Prior to Admission medications   Medication Sig Start Date End Date Taking? Authorizing Provider  gabapentin (NEURONTIN) 300 MG capsule Take 900 mg by mouth every 8 (eight) hours.    Yes Historical Provider, MD  ibuprofen (ADVIL,MOTRIN) 600 MG tablet Take 600 mg by mouth every 6 (six) hours.    Yes Historical Provider, MD  oxyCODONE-acetaminophen (  PERCOCET) 5-325 MG tablet Take 1 tablet by mouth every 6 (six) hours as needed for severe pain. Patient taking differently: Take 2 tablets by mouth every 6 (six) hours as needed for moderate pain or severe pain.  05/12/16  Yes Clydia LlanoMutaz Elmahi, MD  cephALEXin (KEFLEX) 500 MG capsule Take 1 capsule (500 mg total) by mouth 3 (three) times daily. Patient not taking: Reported on 05/26/2016 05/12/16   Clydia LlanoMutaz Elmahi, MD  doxycycline (VIBRAMYCIN) 100 MG capsule Take 1 capsule (100 mg  total) by mouth every 12 (twelve) hours. Patient not taking: Reported on 05/26/2016 05/12/16   Clydia LlanoMutaz Elmahi, MD    Physical Exam: Filed Vitals:   05/26/16 1515  BP: 143/95  Pulse: 98  Temp: 98.1 F (36.7 C)  TempSrc: Oral  Resp: 24  SpO2: 97%     General:  Appears calm and comfortable Eyes:  PERRL, EOMI, normal lids, iris ENT:  grossly normal hearing, lips & tongue, mucus membrane mouth pink and moist Neck:  no LAD, masses or thyromegaly Cardiovascular:  RRR, no m/r/g. No LE edema.  Respiratory:  Breath sounds slightly diminished faint end-expiratory wheeze. Normal respiratory effort. Abdomen:  soft, ntnd, positive bowel sounds throughout no guarding or rebounding Skin:  no rash or induration seen on limited exam Musculoskeletal:  Left hand with dressing dry and intact. Fingers with swelling no erythema decreased range of motion very tender to palpation. Fingers warm to touch Psychiatric:  grossly normal mood and affect, speech fluent and appropriate, AOx3 Neurologic:  CN 2-12 grossly intact, moves all extremities in coordinated fashion, sensation intact  Labs on Admission: I have personally reviewed following labs and imaging studies  CBC: No results for input(s): WBC, NEUTROABS, HGB, HCT, MCV, PLT in the last 168 hours. Basic Metabolic Panel: No results for input(s): NA, K, CL, CO2, GLUCOSE, BUN, CREATININE, CALCIUM, MG, PHOS in the last 168 hours. GFR: CrCl cannot be calculated (Unknown ideal weight.). Liver Function Tests: No results for input(s): AST, ALT, ALKPHOS, BILITOT, PROT, ALBUMIN in the last 168 hours. No results for input(s): LIPASE, AMYLASE in the last 168 hours. No results for input(s): AMMONIA in the last 168 hours. Coagulation Profile: No results for input(s): INR, PROTIME in the last 168 hours. Cardiac Enzymes: No results for input(s): CKTOTAL, CKMB, CKMBINDEX, TROPONINI in the last 168 hours. BNP (last 3 results) No results for input(s): PROBNP in the  last 8760 hours. HbA1C: No results for input(s): HGBA1C in the last 72 hours. CBG: No results for input(s): GLUCAP in the last 168 hours. Lipid Profile: No results for input(s): CHOL, HDL, LDLCALC, TRIG, CHOLHDL, LDLDIRECT in the last 72 hours. Thyroid Function Tests: No results for input(s): TSH, T4TOTAL, FREET4, T3FREE, THYROIDAB in the last 72 hours. Anemia Panel: No results for input(s): VITAMINB12, FOLATE, FERRITIN, TIBC, IRON, RETICCTPCT in the last 72 hours. Urine analysis: No results found for: COLORURINE, APPEARANCEUR, LABSPEC, PHURINE, GLUCOSEU, HGBUR, BILIRUBINUR, KETONESUR, PROTEINUR, UROBILINOGEN, NITRITE, LEUKOCYTESUR  Creatinine Clearance: CrCl cannot be calculated (Unknown ideal weight.).  Sepsis Labs: @LABRCNTIP (procalcitonin:4,lacticidven:4) )No results found for this or any previous visit (from the past 240 hour(s)).   Radiological Exams on Admission: No results found.  EKG:   Assessment/Plan Principal Problem:   Abscess of left hand Active Problems:   HTN (hypertension)   Tobacco use disorder   Diarrhea   #1. Abscess of left hand. Since seen a Dr. Merrilee SeashoreKuzma's office today who recommended admission for further workup of recurrent abscess left hand. Previous I & D with cultures revealing  few staph, MRSA negative. Reportedly patient completed antibiotics after last discharge. Has taken doxy and keflex.  Dr. Merlyn LotKuzma will follow and decide on need for further surgery -Admit -Blood cultures -Vancomycin per pharmacy -Pain management -Dr Merlyn LotKuzma to follow  -ID consult  #2. Hypertension. Patient denies ever being diagnosed with hypertension. Not on any medication. Likely related to #1 -Pain management -When necessary hydralazine  #3. Diarrhea. Patient reports 7 episodes of loose stool over the last 36 hours. Related to recent antibiotics -Stool for C. difficile PCR -monitor electrolytes -enteric precautions  #4. Tobacco use. History of same. Cessation counseling  offered    DVT prophylaxis: lovenox Code Status: full  Family Communication: none Disposition Plan: back to jail  Consults called: ID, ortho  Admission status: obs    Gwenyth BenderBLACK,Ory Elting M MD [facility]  If 7PM-7AM, please contact night-coverage [email or web address] Password [email or web address]  05/26/2016, 5:48 PM

## 2016-05-27 ENCOUNTER — Observation Stay (HOSPITAL_COMMUNITY): Admitting: Certified Registered Nurse Anesthetist

## 2016-05-27 ENCOUNTER — Encounter (HOSPITAL_COMMUNITY): Admission: AD | Payer: Self-pay | Source: Ambulatory Visit | Attending: Internal Medicine

## 2016-05-27 DIAGNOSIS — F1910 Other psychoactive substance abuse, uncomplicated: Secondary | ICD-10-CM

## 2016-05-27 DIAGNOSIS — F17210 Nicotine dependence, cigarettes, uncomplicated: Secondary | ICD-10-CM | POA: Diagnosis present

## 2016-05-27 DIAGNOSIS — Z6839 Body mass index (BMI) 39.0-39.9, adult: Secondary | ICD-10-CM | POA: Diagnosis not present

## 2016-05-27 DIAGNOSIS — L02512 Cutaneous abscess of left hand: Secondary | ICD-10-CM | POA: Diagnosis present

## 2016-05-27 DIAGNOSIS — A09 Infectious gastroenteritis and colitis, unspecified: Secondary | ICD-10-CM | POA: Diagnosis not present

## 2016-05-27 DIAGNOSIS — R197 Diarrhea, unspecified: Secondary | ICD-10-CM | POA: Diagnosis not present

## 2016-05-27 DIAGNOSIS — F17200 Nicotine dependence, unspecified, uncomplicated: Secondary | ICD-10-CM | POA: Diagnosis not present

## 2016-05-27 DIAGNOSIS — I10 Essential (primary) hypertension: Secondary | ICD-10-CM | POA: Diagnosis present

## 2016-05-27 DIAGNOSIS — A047 Enterocolitis due to Clostridium difficile: Secondary | ICD-10-CM | POA: Diagnosis present

## 2016-05-27 DIAGNOSIS — M79642 Pain in left hand: Secondary | ICD-10-CM | POA: Diagnosis present

## 2016-05-27 HISTORY — PX: APPLICATION OF WOUND VAC: SHX5189

## 2016-05-27 HISTORY — PX: I & D EXTREMITY: SHX5045

## 2016-05-27 LAB — BASIC METABOLIC PANEL
Anion gap: 9 (ref 5–15)
BUN: 8 mg/dL (ref 6–20)
CHLORIDE: 104 mmol/L (ref 101–111)
CO2: 25 mmol/L (ref 22–32)
CREATININE: 0.86 mg/dL (ref 0.61–1.24)
Calcium: 9.3 mg/dL (ref 8.9–10.3)
GFR calc Af Amer: >60 mL/min (ref >60)
GFR calc non Af Amer: >60 mL/min (ref >60)
Glucose, Bld: 92 mg/dL (ref 65–99)
Potassium: 4.1 mmol/L (ref 3.5–5.1)
Sodium: 138 mmol/L (ref 135–145)

## 2016-05-27 LAB — CBC
HEMATOCRIT: 46.9 % (ref 39.0–52.0)
HEMOGLOBIN: 14.8 g/dL (ref 13.0–17.0)
MCH: 28.4 pg (ref 26.0–34.0)
MCHC: 31.6 g/dL (ref 30.0–36.0)
MCV: 90 fL (ref 78.0–100.0)
Platelets: 234 10*3/uL (ref 150–400)
RBC: 5.21 MIL/uL (ref 4.22–5.81)
RDW: 14.8 % (ref 11.5–15.5)
WBC: 7.8 10*3/uL (ref 4.0–10.5)

## 2016-05-27 LAB — SURGICAL PCR SCREEN
MRSA, PCR: NEGATIVE
STAPHYLOCOCCUS AUREUS: NEGATIVE

## 2016-05-27 SURGERY — IRRIGATION AND DEBRIDEMENT EXTREMITY
Anesthesia: General | Site: Hand | Laterality: Left

## 2016-05-27 SURGERY — IRRIGATION AND DEBRIDEMENT EXTREMITY
Anesthesia: General | Laterality: Left

## 2016-05-27 MED ORDER — LACTATED RINGERS IV SOLN: INTRAVENOUS | Status: DC

## 2016-05-27 MED ORDER — LIDOCAINE HCL (CARDIAC) 20 MG/ML IV SOLN
INTRAVENOUS | Status: DC | PRN
  Administered 2016-05-27: 50 mg via INTRAVENOUS

## 2016-05-27 MED ORDER — MIDAZOLAM HCL 5 MG/5ML IJ SOLN
INTRAMUSCULAR | Status: DC | PRN
Start: 2016-05-27 — End: 2016-05-27
  Administered 2016-05-27: 2 mg via INTRAVENOUS

## 2016-05-27 MED ORDER — OXYCODONE HCL 5 MG PO TABS
10.0000 mg | ORAL_TABLET | Freq: Once | ORAL | Status: AC
  Administered 2016-05-27: 10 mg via ORAL

## 2016-05-27 MED ORDER — HYDROMORPHONE HCL 1 MG/ML IJ SOLN
1.0000 mg | Freq: Once | INTRAMUSCULAR | Status: AC
  Administered 2016-05-27: 1 mg via INTRAVENOUS
  Filled 2016-05-27: qty 1

## 2016-05-27 MED ORDER — HYDROMORPHONE HCL 1 MG/ML IJ SOLN
0.5000 mg | INTRAMUSCULAR | Status: DC | PRN
  Administered 2016-05-28 – 2016-05-29 (×7): 1.5 mg via INTRAVENOUS
  Filled 2016-05-27 (×7): qty 2

## 2016-05-27 MED ORDER — HYDROMORPHONE HCL 1 MG/ML IJ SOLN
0.5000 mg | Freq: Once | INTRAMUSCULAR | Status: DC
  Filled 2016-05-27: qty 1

## 2016-05-27 MED ORDER — KETOROLAC TROMETHAMINE 30 MG/ML IJ SOLN
INTRAMUSCULAR | Status: AC
  Filled 2016-05-27: qty 1

## 2016-05-27 MED ORDER — 0.9 % SODIUM CHLORIDE (POUR BTL) OPTIME
TOPICAL | Status: DC | PRN
  Administered 2016-05-27: 1000 mL

## 2016-05-27 MED ORDER — PROPOFOL 10 MG/ML IV BOLUS
INTRAVENOUS | Status: DC | PRN
  Administered 2016-05-27: 200 mg via INTRAVENOUS

## 2016-05-27 MED ORDER — ENOXAPARIN SODIUM 60 MG/0.6ML ~~LOC~~ SOLN
60.0000 mg | SUBCUTANEOUS | Status: DC
  Administered 2016-05-28 – 2016-05-30 (×3): 60 mg via SUBCUTANEOUS
  Filled 2016-05-27 (×5): qty 0.6

## 2016-05-27 MED ORDER — PROMETHAZINE HCL 25 MG/ML IJ SOLN: 6.2500 mg | INTRAMUSCULAR | Status: DC | PRN

## 2016-05-27 MED ORDER — BUPIVACAINE HCL (PF) 0.25 % IJ SOLN
INTRAMUSCULAR | Status: AC
  Filled 2016-05-27: qty 30

## 2016-05-27 MED ORDER — KETOROLAC TROMETHAMINE 30 MG/ML IJ SOLN
30.0000 mg | Freq: Once | INTRAMUSCULAR | Status: AC
  Administered 2016-05-27: 30 mg via INTRAVENOUS

## 2016-05-27 MED ORDER — FENTANYL CITRATE (PF) 250 MCG/5ML IJ SOLN
INTRAMUSCULAR | Status: AC
  Filled 2016-05-27: qty 5

## 2016-05-27 MED ORDER — HYDROMORPHONE HCL 1 MG/ML IJ SOLN
INTRAMUSCULAR | Status: AC
  Filled 2016-05-27: qty 1

## 2016-05-27 MED ORDER — FENTANYL CITRATE (PF) 100 MCG/2ML IJ SOLN
INTRAMUSCULAR | Status: DC | PRN
  Administered 2016-05-27: 100 ug via INTRAVENOUS

## 2016-05-27 MED ORDER — OXYCODONE HCL 5 MG PO TABS
ORAL_TABLET | ORAL | Status: AC
  Filled 2016-05-27: qty 2

## 2016-05-27 MED ORDER — HYDROMORPHONE HCL 1 MG/ML IJ SOLN
0.2500 mg | INTRAMUSCULAR | Status: DC | PRN
  Administered 2016-05-27 (×3): 0.5 mg via INTRAVENOUS

## 2016-05-27 MED ORDER — MIDAZOLAM HCL 2 MG/2ML IJ SOLN
INTRAMUSCULAR | Status: AC
  Filled 2016-05-27: qty 2

## 2016-05-27 MED ORDER — HYDROMORPHONE HCL 1 MG/ML IJ SOLN
1.5000 mg | INTRAMUSCULAR | Status: DC | PRN
  Administered 2016-05-27: 1.5 mg via INTRAVENOUS
  Filled 2016-05-27: qty 2

## 2016-05-27 MED ORDER — VANCOMYCIN HCL 10 G IV SOLR
1250.0000 mg | Freq: Three times a day (TID) | INTRAVENOUS | Status: DC
  Administered 2016-05-27 – 2016-05-29 (×6): 1250 mg via INTRAVENOUS
  Filled 2016-05-27 (×5): qty 1250

## 2016-05-27 MED ORDER — LACTATED RINGERS IV SOLN
INTRAVENOUS | Status: DC | PRN
  Administered 2016-05-27: 16:00:00 via INTRAVENOUS

## 2016-05-27 MED ORDER — HYDROMORPHONE HCL 1 MG/ML IJ SOLN
0.5000 mg | Freq: Once | INTRAMUSCULAR | Status: AC
Start: 2016-05-27 — End: 2016-05-27
  Administered 2016-05-27: 0.5 mg via INTRAVENOUS
  Filled 2016-05-27: qty 1

## 2016-05-27 SURGICAL SUPPLY — 54 items
BANDAGE ACE 4X5 VEL STRL LF (GAUZE/BANDAGES/DRESSINGS) ×3 IMPLANT
BANDAGE COBAN STERILE 2 (GAUZE/BANDAGES/DRESSINGS) IMPLANT
BANDAGE ELASTIC 3 VELCRO ST LF (GAUZE/BANDAGES/DRESSINGS) ×3 IMPLANT
BANDAGE ELASTIC 4 VELCRO ST LF (GAUZE/BANDAGES/DRESSINGS) ×3 IMPLANT
BNDG COHESIVE 1X5 TAN STRL LF (GAUZE/BANDAGES/DRESSINGS) IMPLANT
BNDG CONFORM 2 STRL LF (GAUZE/BANDAGES/DRESSINGS) IMPLANT
BNDG ESMARK 4X9 LF (GAUZE/BANDAGES/DRESSINGS) ×3 IMPLANT
BNDG GAUZE ELAST 4 BULKY (GAUZE/BANDAGES/DRESSINGS) ×3 IMPLANT
CANISTER WOUND CARE 500ML ATS (WOUND CARE) ×3 IMPLANT
CORDS BIPOLAR (ELECTRODE) ×6 IMPLANT
COVER SURGICAL LIGHT HANDLE (MISCELLANEOUS) ×3 IMPLANT
DECANTER SPIKE VIAL GLASS SM (MISCELLANEOUS) ×3 IMPLANT
DRAIN PENROSE 1/4X12 LTX STRL (WOUND CARE) IMPLANT
DRSG ADAPTIC 3X8 NADH LF (GAUZE/BANDAGES/DRESSINGS) IMPLANT
DRSG EMULSION OIL 3X3 NADH (GAUZE/BANDAGES/DRESSINGS) ×3 IMPLANT
DRSG PAD ABDOMINAL 8X10 ST (GAUZE/BANDAGES/DRESSINGS) ×6 IMPLANT
DRSG VAC ATS SM SENSATRAC (GAUZE/BANDAGES/DRESSINGS) ×3 IMPLANT
GAUZE SPONGE 4X4 12PLY STRL (GAUZE/BANDAGES/DRESSINGS) ×3 IMPLANT
GAUZE XEROFORM 1X8 LF (GAUZE/BANDAGES/DRESSINGS) ×3 IMPLANT
GLOVE BIO SURGEON STRL SZ7.5 (GLOVE) ×3 IMPLANT
GLOVE BIOGEL PI IND STRL 8 (GLOVE) ×1 IMPLANT
GLOVE BIOGEL PI INDICATOR 8 (GLOVE) ×2
GOWN STRL REUS W/ TWL LRG LVL3 (GOWN DISPOSABLE) ×1 IMPLANT
GOWN STRL REUS W/TWL LRG LVL3 (GOWN DISPOSABLE) ×2
KIT BASIN OR (CUSTOM PROCEDURE TRAY) ×3 IMPLANT
KIT ROOM TURNOVER OR (KITS) ×3 IMPLANT
LOOP VESSEL MAXI BLUE (MISCELLANEOUS) IMPLANT
MANIFOLD NEPTUNE II (INSTRUMENTS) ×3 IMPLANT
NEEDLE HYPO 25X1 1.5 SAFETY (NEEDLE) IMPLANT
NS IRRIG 1000ML POUR BTL (IV SOLUTION) ×3 IMPLANT
PACK ORTHO EXTREMITY (CUSTOM PROCEDURE TRAY) ×3 IMPLANT
PAD ARMBOARD 7.5X6 YLW CONV (MISCELLANEOUS) ×6 IMPLANT
PAD CAST 4YDX4 CTTN HI CHSV (CAST SUPPLIES) ×1 IMPLANT
PADDING CAST COTTON 4X4 STRL (CAST SUPPLIES) ×2
SCRUB BETADINE 4OZ XXX (MISCELLANEOUS) ×3 IMPLANT
SET CYSTO W/LG BORE CLAMP LF (SET/KITS/TRAYS/PACK) ×3 IMPLANT
SOLUTION BETADINE 4OZ (MISCELLANEOUS) ×3 IMPLANT
SPLINT PLASTER CAST XFAST 3X15 (CAST SUPPLIES) ×1 IMPLANT
SPLINT PLASTER XTRA FASTSET 3X (CAST SUPPLIES) ×2
SPONGE GAUZE 4X4 12PLY STER LF (GAUZE/BANDAGES/DRESSINGS) ×3 IMPLANT
SPONGE LAP 4X18 X RAY DECT (DISPOSABLE) ×3 IMPLANT
SUT ETHILON 4 0 P 3 18 (SUTURE) IMPLANT
SUT ETHILON 4 0 PS 2 18 (SUTURE) ×3 IMPLANT
SUT MON AB 5-0 P3 18 (SUTURE) IMPLANT
SYR CONTROL 10ML LL (SYRINGE) IMPLANT
TOWEL OR 17X24 6PK STRL BLUE (TOWEL DISPOSABLE) ×3 IMPLANT
TOWEL OR 17X26 10 PK STRL BLUE (TOWEL DISPOSABLE) ×3 IMPLANT
TUBE ANAEROBIC SPECIMEN COL (MISCELLANEOUS) IMPLANT
TUBE CONNECTING 12'X1/4 (SUCTIONS) ×2
TUBE CONNECTING 12X1/4 (SUCTIONS) ×4 IMPLANT
TUBE FEEDING 5FR 15 INCH (TUBING) IMPLANT
UNDERPAD 30X30 INCONTINENT (UNDERPADS AND DIAPERS) ×3 IMPLANT
WATER STERILE IRR 1000ML POUR (IV SOLUTION) ×3 IMPLANT
YANKAUER SUCT BULB TIP NO VENT (SUCTIONS) ×3 IMPLANT

## 2016-05-27 NOTE — Progress Notes (Signed)
Paged [employer] regarding the defective power adapter of patient's wound VAC.  Transferred VAC's power adapter to several outlets and plug and unplug adapter to wound VAC machine but do not any light at the back of wound VAC machine.  Currently Wound VAC is off due to drain battery.  Awaiting response from [employer].

## 2016-05-27 NOTE — Progress Notes (Addendum)
Pharmacy Antibiotic Note  Derrick Rivers is a 35 y.o. male admitted on 05/26/2016 with a persistent LUE abscess/cellulitis s/p I&D on 5/23, 6/8, and 6/15 - most recent I&D did not yield any positive cultures so the patient was d/ced on po Doxy/Keflex however was noncompliant with regimen. Ortho/ID consulted this admit - getting repeat I&D 7/4 - will f/u cx results. Pharmacy consulted this admit to dose Vancomycin.  Upon review of prior Vancomycin dosing and troughs - will go ahead and adjust the patient's regimen to a higher dose to target a therapeutic range. Renal function is stable.  Also, given the patient's BMI>30 and CrCl>30 ml/min - will adjust the lovenox dose today to 0.5 mg/kg. Will schedule new dose to start on 7/5 AM since I&D planned for today.  Plan: 1. Adjust Vancomycin to 1250 mg IV every 8 hours 2. Adjust Lovenox to 60 mg SQ every 24 hours 2. Will continue to follow renal function, culture results, LOT, and antibiotic de-escalation plans   Height: 5\' 9"  (175.3 cm) Weight: 270 lb (122.471 kg) IBW/kg (Calculated) : 70.7  Temp (24hrs), Avg:98.3 F (36.8 C), Min:98.1 F (36.7 C), Max:98.8 F (37.1 C)   Recent Labs Lab 05/26/16 1929 05/27/16 0451  WBC 9.7 7.8  CREATININE 0.89 0.86    Estimated Creatinine Clearance: 155 mL/min (by C-G formula based on Cr of 0.86).    Allergies  Allergen Reactions  . Multihance [Gadobenate] Nausea And Vomiting    Questionable, per patient    Antimicrobials this admission: Doxy/Keflex PTA (completed 10d prior to this admit but was noncompliant) Vanc 7/3 >>  Dose adjustments this admission: 7/4 >> dose increased to 1250 mg/8h upon review of prior VT and SCr  Microbiology results: Prior cx:  6/8 WCx >> MRSE  This admit: 7/3 MRSA PCR >> neg 7/3 CDiff antigen +, toxin - >> negative, colonized 7/3 BCx >> ngtd  Thank you for allowing pharmacy to be a part of this patient's care.  Georgina PillionElizabeth Mee Macdonnell, PharmD, BCPS Clinical  Pharmacist Pager: [phone] 05/27/2016 1:12 PM
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
## 2016-05-27 NOTE — Op Note (Addendum)
NAMCory Roughen:  Gentry, Garnie              ACCOUNT NO.:  [id number]  MEDICAL RECORD NO.:  [id number]  LOCATION:  [facility]                         FACILITY:  [facility]  PHYSICIAN:  Betha LoaKevin Torion Hulgan, MD        DATE OF BIRTH:  [birth year]  DATE OF PROCEDURE:  05/27/2016 DATE OF DISCHARGE:  05/27/2016                              OPERATIVE REPORT   PREOPERATIVE DIAGNOSIS:  Left hand recurrent infection.  POSTOPERATIVE DIAGNOSIS:  Left hand recurrent infection.  PROCEDURE:  Incision and drainage of left hand wound including sharp excisional debridement of skin and subcutaneous tissues with the knife and placement of wound VAC.  SURGEON:  Betha LoaKevin Maiko Salais, MD.  ASSISTANT:  None.  ANESTHESIA:  General.  IV FLUIDS:  Per anesthesia flow sheet.  ESTIMATED BLOOD LOSS:  Minimal.  COMPLICATIONS:  None.  SPECIMENS:  Cultures to micro.  TOURNIQUET TIME:  30 minutes.  DISPOSITION:  Stable to PACU.  INDICATIONS:  Mr. Benancio Deedsewsome is a 35 year old male, who has undergone 3 previous incision and drainage of the left hand for recurrent infection. Over the past few days, he has been having increasing induration and redness on the dorsum of the hand.  He returned to the operating room today for repeat incision and drainage/irrigation and debridement. Risks, benefits, and alternatives of surgery were reviewed including the risk of blood loss; infection; damage to nerves, vessels, tendons, ligaments, bone; failure of surgery; need for additional surgery; complications with wound healing; continued pain; continued infection; need for repeat irrigation and debridement.  He voiced understanding of these risks and elected to proceed.  OPERATIVE COURSE:  After being identified preoperatively by myself, the patient and I agreed upon procedure and site of procedure.  Surgical site was marked.  The risks, benefits, and alternatives of surgery were reviewed and he wished to proceed.  Surgical consent had been signed. He is on  scheduled antibiotics.  He was transferred to the operating room and placed on the operating room table in supine position with the left upper extremity on arm board.  General anesthesia was induced by anesthesiologist.  The left upper extremity was prepped and draped in normal sterile orthopedic fashion.  Surgical pause was performed between surgeons, anesthesia, operating staff, and all were in agreement as to the patient, procedure, and site of procedure.  Tourniquet at the proximal aspect of the extremity was inflated to 250 mmHg after exsanguination of the limb with an Esmarch bandage.  The wound was explored.  There was induration of the tissues at the distal aspect of the wound over the MP joints of the long and ring fingers.  The incision was extended distally.  The area was opened up.  There was significant scar tissue underneath.  It was reddened with some brawny edema. Cultures were taken.  The knife was used to sharply debride the skin edges back to a good stable edge.  The subcutaneous tissues were partially debrided with the knife as well.  Scissors were used to debride the subcutaneous tissues distally that were reddened.  The wound was then copiously irrigated with 1000 mL of sterile saline by cysto tubing.  A VAC dressing was then placed.  Good suction was obtained.  The wrist was placed into a volar splint for comfort.  This was wrapped with Kerlix and Ace bandage.  Tourniquet deflated at 30 minutes. Fingertips were all pink with brisk capillary refill after deflation of tourniquet.  Operative drapes were broken down.  The patient was awoken from anesthesia safely.  He was transferred back to stretcher and taken to PACU in stable condition.  He will continue on his IV antibiotics, will start back changes in 2-3 days.     Betha LoaKevin Berlynn Warsame, MD     KK/MEDQ  D:  05/27/2016  T:  05/27/2016  Job:  [id number]  Addendum (06/05/16): Clarify type of debridement.

## 2016-05-27 NOTE — Brief Op Note (Signed)
05/26/2016 - 05/27/2016  5:51 PM  PATIENT:  Derrick Rivers  35 y.o. male  PRE-OPERATIVE DIAGNOSIS:  cellulitis  POST-OPERATIVE DIAGNOSIS:  cellulitis  PROCEDURE:  Procedure(s): IRRIGATION AND DEBRIDEMENT OF HAND (Left) APPLICATION OF WOUND VAC (Left)  SURGEON:  Surgeon(s) and Role:    * Betha LoaKevin Kort Stettler, MD - Primary  PHYSICIAN ASSISTANT:   ASSISTANTS: none   ANESTHESIA:   general  EBL:     BLOOD ADMINISTERED:none  DRAINS: none   LOCAL MEDICATIONS USED:  NONE  SPECIMEN:  Source of Specimen:  left hand  DISPOSITION OF SPECIMEN:  micro  COUNTS:  YES  TOURNIQUET:   Total Tourniquet Time Documented: Upper Arm (Left) - 30 minutes Total: Upper Arm (Left) - 30 minutes   DICTATION: .Other Dictation: Dictation Number [id number]  PLAN OF CARE: return to floor  PATIENT DISPOSITION:  PACU - hemodynamically stable.   Delay start of Pharmacological VTE agent (>24hrs) due to surgical blood loss or risk of bleeding: no

## 2016-05-27 NOTE — Progress Notes (Signed)
LCSW acknowledges consult: patient admitted from jail, current inmate. Currently no SW interventions warranted. Patient will DC back to jail and [facility].  If needs arise, please re-consult.  Deretha EmoryHannah Jakaya Jacobowitz LCSW, MSW Clinical Social Work: Optician, [employer]ystem [employer]

## 2016-05-27 NOTE — Op Note (Signed)
[id number] 

## 2016-05-27 NOTE — Progress Notes (Signed)
Wound VAC is now functioning after its power adapter was replaced by a staff from [employer].

## 2016-05-27 NOTE — Anesthesia Preprocedure Evaluation (Signed)
Anesthesia Evaluation  Patient identified by MRN, date of birth, ID band Patient awake    Reviewed: Allergy & Precautions, NPO status , Patient's Chart, lab work & pertinent test results  Airway Mallampati: II  TM Distance: >3 FB Neck ROM: Full    Dental no notable dental hx.    Pulmonary Current Smoker,    Pulmonary exam normal breath sounds clear to auscultation       Cardiovascular hypertension, Normal cardiovascular exam Rhythm:Regular Rate:Normal     Neuro/Psych negative neurological ROS  negative psych ROS   GI/Hepatic negative GI ROS, (+)     substance abuse  cocaine use and IV drug use,   Endo/Other  Morbid obesity  Renal/GU negative Renal ROS  negative genitourinary   Musculoskeletal negative musculoskeletal ROS (+)   Abdominal   Peds negative pediatric ROS (+)  Hematology negative hematology ROS (+)   Anesthesia Other Findings   Reproductive/Obstetrics negative OB ROS                             Anesthesia Physical Anesthesia Plan  ASA: III  Anesthesia Plan: General   Post-op Pain Management:    Induction: Intravenous  Airway Management Planned: Oral ETT and LMA  Additional Equipment:   Intra-op Plan:   Post-operative Plan: Extubation in OR  Informed Consent: I have reviewed the patients History and Physical, chart, labs and discussed the procedure including the risks, benefits and alternatives for the proposed anesthesia with the patient or authorized representative who has indicated his/her understanding and acceptance.   Dental advisory given  Plan Discussed with: CRNA and Surgeon  Anesthesia Plan Comments:         Anesthesia Quick Evaluation

## 2016-05-27 NOTE — Progress Notes (Signed)
Patient transported to OR for surgery. Report called to short stay.

## 2016-05-27 NOTE — Anesthesia Procedure Notes (Signed)
Procedure Name: LMA Insertion Date/Time: 05/27/2016 5:04 PM Performed by: Gwenyth AllegraADAMI, Jazmine Heckman Pre-anesthesia Checklist: Patient identified, Emergency Drugs available, Suction available, Patient being monitored and Timeout performed Patient Re-evaluated:Patient Re-evaluated prior to inductionOxygen Delivery Method: Circle system utilized Preoxygenation: Pre-oxygenation with 100% oxygen Intubation Type: IV induction LMA: LMA inserted LMA Size: 5.0 Placement Confirmation: positive ETCO2 and breath sounds checked- equal and bilateral Tube secured with: Tape Dental Injury: Teeth and Oropharynx as per pre-operative assessment

## 2016-05-27 NOTE — Transfer of Care (Signed)
Immediate Anesthesia Transfer of Care Note  Patient: Bland SpanJoseph Beutler  Procedure(s) Performed: Procedure(s): IRRIGATION AND DEBRIDEMENT OF HAND (Left) APPLICATION OF WOUND VAC (Left)  Patient Location: PACU  Anesthesia Type:General  Level of Consciousness: awake, alert  and oriented  Airway & Oxygen Therapy: Patient Spontanous Breathing and Patient connected to nasal cannula oxygen  Post-op Assessment: Report given to RN and Post -op Vital signs reviewed and stable  Post vital signs: Reviewed and stable  Last Vitals:  Filed Vitals:   05/27/16 1608 05/27/16 1805  BP: 122/72 135/93  Pulse: 81 83  Temp: 36.6 C 36.8 C  Resp: 19 13    Last Pain:  Filed Vitals:   05/27/16 1810  PainSc: 5       Patients Stated Pain Goal: 0 (05/26/16 1912)  Complications: No apparent anesthesia complications

## 2016-05-27 NOTE — Progress Notes (Signed)
PROGRESS NOTE    Derrick Rivers  GEX:[id number] DOB: 1981-05-03 DOA: 05/26/2016 PCP: Pcp Not In System  Brief Narrative: Derrick Rivers is a 35yo gentleman with a history of recurrent cellulitis and abscess to his left hand; followed by Dr. Merlyn Lot. He has had prior I and D x 3. In June, he grew MDR coag negative staph from his wound culture. Just discharged 12days ago on PO Abx, back with worsening infection.  Assessment & Plan:  1.Recurrent  Abscess of left hand. -s/p I&D x3 -initial wound cx was negative, last month Coag neg staph -on IV VAnc, plan for I&D today -ID Dr.Campbell consulted -follow OR cultures  #2. Elevated BP -no h/o HTN, likely related to #1 -improved  #3. C diff colitis -frequent loose stools for few days and multiple Abx exposures - C. difficile PCR Ag positive and toxin negative, due to profuse diarrhea will Rx with PO Vanc  #4. Tobacco use. -counseled  DVT prophylaxis: lovenox Code Status: full  Family Communication: none Disposition Plan: back to jail    Consultants:   ID  Dr.KUzma   Antimicrobials:Vanc-IV and PO   Subjective: Having watery diarrhea, pain in hand  Objective: Filed Vitals:   05/27/16 0436 05/27/16 0451 05/27/16 0700 05/27/16 1150  BP:  110/76  112/68  Pulse:  82  70  Temp:  98.2 F (36.8 C)  98.2 F (36.8 C)  TempSrc:  Oral  Oral  Resp:  19  19  Height:      Weight:      SpO2: 77% 92% 96% 97%    Intake/Output Summary (Last 24 hours) at 05/27/16 1407 Last data filed at 05/26/16 1900  Gross per 24 hour  Intake    750 ml  Output      0 ml  Net    750 ml   Filed Weights   05/26/16 1630  Weight: 122.471 kg (270 lb)    Examination:  General exam: Appears calm and comfortable , no distress Respiratory system: Clear to auscultation. Respiratory effort normal. Cardiovascular system: S1 & S2 heard, RRR. No JVD, murmurs, rubs, gallops or clicks. No pedal edema. Gastrointestinal system: Abdomen is nondistended, soft  and nontender. No organomegaly or masses felt. Normal bowel sounds heard. Central nervous system: Alert and oriented. No focal neurological deficits. Extremities: Symmetric 5 x 5 power, L hand with open surgical incision on dorsum with soft tissue swelling, minimal serosanguineous drainage Skin: No rashes, lesions or ulcers Psychiatry: Judgement and insight appear normal. Mood & affect appropriate.     Data Reviewed: I have personally reviewed following labs and imaging studies  CBC:  Recent Labs Lab 05/26/16 1929 05/27/16 0451  WBC 9.7 7.8  NEUTROABS 5.5  --   HGB 15.6 14.8  HCT 49.6 46.9  MCV 90.7 90.0  PLT 253 234   Basic Metabolic Panel:  Recent Labs Lab 05/26/16 1929 05/27/16 0451  NA 138 138  K 3.9 4.1  CL 105 104  CO2 24 25  GLUCOSE 98 92  BUN 6 8  CREATININE 0.89 0.86  CALCIUM 9.5 9.3   GFR: Estimated Creatinine Clearance: 155 mL/min (by C-G formula based on Cr of 0.86). Liver Function Tests:  Recent Labs Lab 05/26/16 1929  AST 33  ALT 62  ALKPHOS 62  BILITOT 0.7  PROT 7.1  ALBUMIN 4.3   No results for input(s): LIPASE, AMYLASE in the last 168 hours. No results for input(s): AMMONIA in the last 168 hours. Coagulation Profile:  Recent Labs Lab  05/26/16 1929  INR 1.07   Cardiac Enzymes: No results for input(s): CKTOTAL, CKMB, CKMBINDEX, TROPONINI in the last 168 hours. BNP (last 3 results) No results for input(s): PROBNP in the last 8760 hours. HbA1C: No results for input(s): HGBA1C in the last 72 hours. CBG: No results for input(s): GLUCAP in the last 168 hours. Lipid Profile: No results for input(s): CHOL, HDL, LDLCALC, TRIG, CHOLHDL, LDLDIRECT in the last 72 hours. Thyroid Function Tests: No results for input(s): TSH, T4TOTAL, FREET4, T3FREE, THYROIDAB in the last 72 hours. Anemia Panel: No results for input(s): VITAMINB12, FOLATE, FERRITIN, TIBC, IRON, RETICCTPCT in the last 72 hours. Urine analysis: No results found for:  COLORURINE, APPEARANCEUR, LABSPEC, PHURINE, GLUCOSEU, HGBUR, BILIRUBINUR, KETONESUR, PROTEINUR, UROBILINOGEN, NITRITE, LEUKOCYTESUR Sepsis Labs: @LABRCNTIP (procalcitonin:4,lacticidven:4)  ) Recent Results (from the past 240 hour(s))  MRSA PCR Screening     Status: None   Collection Time: 05/26/16  5:23 PM  Result Value Ref Range Status   MRSA by PCR NEGATIVE NEGATIVE Final    Comment:        The GeneXpert MRSA Assay (FDA approved for NASAL specimens only), is one component of a comprehensive MRSA colonization surveillance program. It is not intended to diagnose MRSA infection nor to guide or monitor treatment for MRSA infections.   Culture, blood (routine x 2)     Status: None (Preliminary result)   Collection Time: 05/26/16  6:51 PM  Result Value Ref Range Status   Specimen Description BLOOD RIGHT ANTECUBITAL  Final   Special Requests BOTTLES DRAWN AEROBIC ONLY 5CC  Final   Culture NO GROWTH < 24 HOURS  Final   Report Status PENDING  Incomplete  Culture, blood (routine x 2)     Status: None (Preliminary result)   Collection Time: 05/26/16  6:55 PM  Result Value Ref Range Status   Specimen Description BLOOD LEFT HAND  Final   Special Requests BOTTLES DRAWN AEROBIC AND ANAEROBIC 5CC  Final   Culture NO GROWTH < 24 HOURS  Final   Report Status PENDING  Incomplete  C difficile quick scan w PCR reflex     Status: Abnormal   Collection Time: 05/26/16 10:36 PM  Result Value Ref Range Status   C Diff antigen POSITIVE (A) NEGATIVE Final   C Diff toxin NEGATIVE NEGATIVE Final   C Diff interpretation   Final    C. difficile present, but toxin not detected. This indicates colonization. In most cases, this does not require treatment. If patient has signs and symptoms consistent with colitis, consider treatment. Requires ENTERIC precautions.         Radiology Studies: Dg Hand Complete Left  05/26/2016  CLINICAL DATA:  Cellulitis. Unable to extend fingers. Spider bite 1 month ago  with soft tissue infection. EXAM: LEFT HAND - COMPLETE 3+ VIEW COMPARISON:  MRI 05/08/2016.  Radiography 04/15/2016. FINDINGS: Persistent or recurrent generalized soft tissue swelling. No sign of osteomyelitis or radiopaque foreign object. IMPRESSION: Persistent or recurrent generalized soft tissue swelling. Electronically Signed   By: Paulina FusiMark  Shogry M.D.   On: 05/26/2016 22:37        Scheduled Meds: . albuterol  2.5 mg Nebulization BID  . [START ON 05/28/2016] enoxaparin (LOVENOX) injection  60 mg Subcutaneous Q24H  . gabapentin  900 mg Oral Q8H  . vancomycin  1,250 mg Intravenous Q8H   Continuous Infusions: . lactated ringers          Time spent: 35min    Zannie CovePreetha Lexander, MD [facility] Pager [phone]  If 7PM-7AM, please contact night-coverage [email or web address] Password [email or web address] 05/27/2016, 2:07 PM

## 2016-05-27 NOTE — Anesthesia Postprocedure Evaluation (Signed)
Anesthesia Post Note  Patient: Derrick Rivers  Procedure(s) Performed: Procedure(s) (LRB): IRRIGATION AND DEBRIDEMENT OF HAND (Left) APPLICATION OF WOUND VAC (Left)  Patient location during evaluation: PACU Anesthesia Type: General Level of consciousness: awake and alert Pain management: pain level controlled Vital Signs Assessment: post-procedure vital signs reviewed and stable Respiratory status: spontaneous breathing, nonlabored ventilation, respiratory function stable and patient connected to nasal cannula oxygen Cardiovascular status: blood pressure returned to baseline and stable Postop Assessment: no signs of nausea or vomiting Anesthetic complications: no    Last Vitals:  Filed Vitals:   05/27/16 1815 05/27/16 1830  BP: 154/92 118/92  Pulse: 78 79  Temp:    Resp: 14 15    Last Pain:  Filed Vitals:   05/27/16 1844  PainSc: 8                  Hannelore Bova S

## 2016-05-27 NOTE — Progress Notes (Signed)
Subjective: Continued pain in left hand.  States it kept him up at night.  Requesting change to pain medication administration to what it was at last admission.    Objective: Vital signs in last 24 hours: Temp:  [98.1 F (36.7 C)-98.8 F (37.1 C)] 98.2 F (36.8 C) (07/04 0451) Pulse Rate:  [82-98] 82 (07/04 0451) Resp:  [19-24] 19 (07/04 0451) BP: (110-144)/(68-95) 110/76 mmHg (07/04 0451) SpO2:  [77 %-98 %] 96 % (07/04 0700) Weight:  [122.471 kg (270 lb)] 122.471 kg (270 lb) (07/03 1630)  Intake/Output from previous day: 07/03 0701 - 07/04 0700 In: 750 [P.O.:240; I.V.:10; IV Piggyback:500] Out: -  Intake/Output this shift:     Recent Labs  05/26/16 1929 05/27/16 0451  HGB 15.6 14.8    Recent Labs  05/26/16 1929 05/27/16 0451  WBC 9.7 7.8  RBC 5.47 5.21  HCT 49.6 46.9  PLT 253 234    Recent Labs  05/26/16 1929 05/27/16 0451  NA 138 138  K 3.9 4.1  CL 105 104  CO2 24 25  BUN 6 8  CREATININE 0.89 0.86  GLUCOSE 98 92  CALCIUM 9.5 9.3    Recent Labs  05/26/16 1929  INR 1.07    intact capillary refill all digits.  fingers held in flexed position.  dorsal skin indurated and ruborous.  no proximal streaking.  no gross purulence.    Assessment/Plan: Left hand continued infection with indurated skin.  Plan OR today for repeat I&D.  Changed dilaudid dose to aid pain control.  May consider pain management consult given narcotic tolerance.  Risks, benefits, and alternatives of surgery were discussed and the patient agrees with the plan of care.    Quenesha Douglass R 05/27/2016, 9:11 AM

## 2016-05-27 NOTE — Progress Notes (Signed)
Patient still complain of pain at 6/10 after Oxy IR 10 mg given at 1957 and wants to get Dilaudid IV since patient had just I & D at left hand this PM.  Patient used to get Ddilaudid IV prior to the I & D.  Paged [facility] floor coverage and awaiting response.

## 2016-05-28 ENCOUNTER — Encounter (HOSPITAL_COMMUNITY): Payer: Self-pay | Admitting: Orthopedic Surgery

## 2016-05-28 LAB — VANCOMYCIN, TROUGH: Vancomycin Tr: 20 ug/mL (ref 15–20)

## 2016-05-28 MED ORDER — VANCOMYCIN 50 MG/ML ORAL SOLUTION
125.0000 mg | Freq: Four times a day (QID) | ORAL | Status: DC
  Administered 2016-05-28 – 2016-06-02 (×21): 125 mg via ORAL
  Filled 2016-05-28 (×24): qty 2.5

## 2016-05-28 MED ORDER — CYCLOBENZAPRINE HCL 5 MG PO TABS
7.5000 mg | ORAL_TABLET | Freq: Three times a day (TID) | ORAL | Status: DC
  Administered 2016-05-28 (×3): 7.5 mg via ORAL
  Filled 2016-05-28 (×3): qty 2

## 2016-05-28 NOTE — Progress Notes (Signed)
Pharmacy Antibiotic Note  Derrick Rivers is a 35 y.o. male admitted on 05/26/2016 with a persistent LUE abscess/cellulitis s/p I&D on 5/23, 6/8, and 6/15 - most recent I&D did not yield any positive cultures so the patient was d/ced on po Doxy/Keflex however was noncompliant with regimen. Ortho/ID consulted this admit - getting repeat I&D 7/4 - will f/u cx results. Pharmacy consulted this admit to dose Vancomycin.  Vancomycin trough this evening = 20 mcg / dL --> therapeutic  Plan: Continue Vancomycin 1250 mg iv Q 8 hours Continue to follow  Height: 5\' 9"  (175.3 cm) Weight: 270 lb (122.471 kg) IBW/kg (Calculated) : 70.7  Temp (24hrs), Avg:98.4 F (36.9 C), Min:97.9 F (36.6 C), Max:98.8 F (37.1 C)   Recent Labs Lab 05/26/16 1929 05/27/16 0451 05/28/16 1904  WBC 9.7 7.8  --   CREATININE 0.89 0.86  --   VANCOTROUGH  --   --  20    Estimated Creatinine Clearance: 155 mL/min (by C-G formula based on Cr of 0.86).    Allergies  Allergen Reactions  . Multihance [Gadobenate] Nausea And Vomiting    Questionable, per patient    Thank you Okey RegalLisa Demetrion Wesby, PharmD [phone]  05/28/2016 8:24 PM

## 2016-05-28 NOTE — Progress Notes (Signed)
Patient still complained of pain at his left hand due to recent I & D and wound VAC placement and wanted to talked to Dr. Merlyn LotKuzma.  Paged Dr. Merrilee SeashoreKuzma's office and Dr. Merlyn LotKuzma called back to inform this RN that he will put back the PRN Dilaudid and give Dilaudid 1mg  IV now.  Administered new orders and patient was updated.

## 2016-05-28 NOTE — Progress Notes (Signed)
Patient ID: Derrick Rivers, male   DOB: November 06, 1981, 35 y.o.   MRN: [id number]         [facility]    Date of Admission:  05/26/2016            Day 2 IV vancomycin        Day 1 by mouth vancomycin  Principal Problem:   Abscess of left hand Active Problems:   Polysubstance abuse   HTN (hypertension)   Tobacco use disorder   Diarrhea   . albuterol  2.5 mg Nebulization BID  . cyclobenzaprine  7.5 mg Oral TID  . enoxaparin (LOVENOX) injection  60 mg Subcutaneous Q24H  . gabapentin  900 mg Oral Q8H  . vancomycin  1,250 mg Intravenous Q8H  . vancomycin  125 mg Oral QID    SUBJECTIVE: He is complaining of left hand pain. He notes that his stools are a little more formed today.  Review of Systems: Review of Systems  Constitutional: Negative for fever, chills and diaphoresis.  Gastrointestinal: Positive for diarrhea. Negative for nausea, vomiting and abdominal pain.  Musculoskeletal: Positive for joint pain.    Past Medical History  Diagnosis Date  . Polysubstance abuse   . Cellulitis of left upper extremity   . Abscess of left hand   . Chronic lower back pain     Social History  Substance Use Topics  . Smoking status: Current Every Day Smoker -- 0.50 packs/day for 15 years    Types: Cigarettes  . Smokeless tobacco: Never Used  . Alcohol Use: Yes     Comment: 05/26/2016 "last drink was in 2014"    Family History  Problem Relation Age of Onset  . Hypertension Mother    Allergies  Allergen Reactions  . Multihance [Gadobenate] Nausea And Vomiting    Questionable, per patient    OBJECTIVE: Filed Vitals:   05/28/16 0633 05/28/16 0826 05/28/16 1009 05/28/16 1356  BP: 142/84  121/71 129/76  Pulse: 77  86 83  Temp: 98.8 F (37.1 C)  98.4 F (36.9 C) 98.1 F (36.7 C)  TempSrc:   Oral Oral  Resp: 19  19 19   Height:      Weight:      SpO2: 97% 98% 94% 98%   Body mass index is 39.85 kg/(m^2).  Physical Exam  Constitutional: No  distress.  Musculoskeletal:  He has a VAC wound dressing on his left hand wound.    Lab Results Lab Results  Component Value Date   WBC 7.8 05/27/2016   HGB 14.8 05/27/2016   HCT 46.9 05/27/2016   MCV 90.0 05/27/2016   PLT 234 05/27/2016    Lab Results  Component Value Date   CREATININE 0.86 05/27/2016   BUN 8 05/27/2016   NA 138 05/27/2016   K 4.1 05/27/2016   CL 104 05/27/2016   CO2 25 05/27/2016    Lab Results  Component Value Date   ALT 62 05/26/2016   AST 33 05/26/2016   ALKPHOS 62 05/26/2016   BILITOT 0.7 05/26/2016     Microbiology: Recent Results (from the past 240 hour(s))  MRSA PCR Screening     Status: None   Collection Time: 05/26/16  5:23 PM  Result Value Ref Range Status   MRSA by PCR NEGATIVE NEGATIVE Final    Comment:        The GeneXpert MRSA Assay (FDA approved for NASAL specimens only), is one component of a comprehensive MRSA colonization surveillance program. It  is not intended to diagnose MRSA infection nor to guide or monitor treatment for MRSA infections.   Culture, blood (routine x 2)     Status: None (Preliminary result)   Collection Time: 05/26/16  6:51 PM  Result Value Ref Range Status   Specimen Description BLOOD RIGHT ANTECUBITAL  Final   Special Requests BOTTLES DRAWN AEROBIC ONLY 5CC  Final   Culture NO GROWTH < 24 HOURS  Final   Report Status PENDING  Incomplete  Culture, blood (routine x 2)     Status: None (Preliminary result)   Collection Time: 05/26/16  6:55 PM  Result Value Ref Range Status   Specimen Description BLOOD LEFT HAND  Final   Special Requests BOTTLES DRAWN AEROBIC AND ANAEROBIC 5CC  Final   Culture NO GROWTH < 24 HOURS  Final   Report Status PENDING  Incomplete  C difficile quick scan w PCR reflex     Status: Abnormal   Collection Time: 05/26/16 10:36 PM  Result Value Ref Range Status   C Diff antigen POSITIVE (A) NEGATIVE Final   C Diff toxin NEGATIVE NEGATIVE Final   C Diff interpretation   Final     C. difficile present, but toxin not detected. This indicates colonization. In most cases, this does not require treatment. If patient has signs and symptoms consistent with colitis, consider treatment. Requires ENTERIC precautions.  Surgical pcr screen     Status: None   Collection Time: 05/27/16  3:17 PM  Result Value Ref Range Status   MRSA, PCR NEGATIVE NEGATIVE Final   Staphylococcus aureus NEGATIVE NEGATIVE Final    Comment:        The Xpert SA Assay (FDA approved for NASAL specimens in patients over 36 years of age), is one component of a comprehensive surveillance program.  Test performance has been validated by [facility] for patients greater than or equal to 36 year old. It is not intended to diagnose infection nor to guide or monitor treatment.   Aerobic/Anaerobic Culture (surgical/deep wound)     Status: None (Preliminary result)   Collection Time: 05/27/16  5:29 PM  Result Value Ref Range Status   Specimen Description ABSCESS LEFT HAND  Final   Special Requests PATIENT ON FOLLOWING ZOSYN  Final   Gram Stain   Final    RARE WBC PRESENT, PREDOMINANTLY MONONUCLEAR NO ORGANISMS SEEN    Culture NO GROWTH < 24 HOURS  Final   Report Status PENDING  Incomplete     ASSESSMENT: His operative Gram stain did not show any organisms and cultures are negative at 24 hours. I will continue IV vancomycin pending final cultures. I will continue oral vancomycin now for possible C. difficile diarrhea.  PLAN: 1. Continue current antibiotics pending final cultures  Cliffton AstersJohn Mazi Schuff, MD [facility] [phone] pager   [phone] cell 05/28/2016, 2:11 PM

## 2016-05-28 NOTE — Progress Notes (Signed)
K. Schorr responded by placing orders for Dilaudid 0.5 mg IV at 2230 and at 0230 to alternate with existing PRN Oxy IR 10 mg Q4H. Administered Dilaudid as ordered.

## 2016-05-28 NOTE — Progress Notes (Signed)
[facility] Progress Note  Patient: Derrick Rivers GNF:[id number]   PCP: Pcp Not In System DOB: April 29, 1981   DOA: 05/26/2016   DOS: 05/28/2016   Date of Service: the patient was seen and examined on 05/28/2016  Subjective: Patient mentions the pain is present. No nausea no vomiting. No abdominal pain. Fever is settling. Nutrition: Tolerating oral diet  Brief hospital course: Pt. with PMH of HTN; admitted on 05/26/2016, with complaint of swelling of the left hand, was found to have cellulitis. Patient underwent incision and drainage by hand surgery. Infectious diseases was also consulted. Currently further plan is continue IV antibiotics.  Assessment and Plan: 1.Recurrent Abscess of left hand. -s/p I&D x3 -initial wound cx was negative, last month Coag neg staph -on IV vancomycin, status post IandD, wound VAC placed -ID Dr.Campbell consulted -follow OR cultures  #2. Elevated BP -no h/o HTN, likely related to #1 -improved  #3. C diff colitis -frequent loose stools for few days and multiple Abx exposures - C. difficile PCR Ag positive and toxin negative, due to profuse diarrhea will Rx with PO Vanc  #4. Tobacco use. -counseled  Pain management: Add Flexeril, continue when necessary Dilaudid Activity: No indication at present physical therapy Bowel regimen: last BM 05/28/2016 Diet: Regular diet DVT Prophylaxis: subcutaneous Heparin  Advance goals of care discussion: Full code  Family Communication: no family was present at bedside, at the time of interview.   Disposition:  Discharge to back to prison. Expected discharge date: 05/30/2016.  Consultants: Orthopedics, infectious disease Procedures: Incision and debridement with wound VAC placement  Antibiotics: Anti-infectives    Start     Dose/Rate Route Frequency Ordered Stop   05/28/16 1000  vancomycin (VANCOCIN) 50 mg/mL oral solution 125 mg     125 mg Oral 4 times daily 05/28/16 0750 06/11/16 0959   05/27/16 2000   vancomycin (VANCOCIN) 1,250 mg in sodium chloride 0.9 % 250 mL IVPB     1,250 mg 166.7 mL/hr over 90 Minutes Intravenous Every 8 hours 05/27/16 1312     05/27/16 0200  vancomycin (VANCOCIN) IVPB 1000 mg/200 mL premix  Status:  Discontinued     1,000 mg 200 mL/hr over 60 Minutes Intravenous Every 8 hours 05/26/16 1952 05/27/16 1312   05/26/16 1730  vancomycin (VANCOCIN) 2,000 mg in sodium chloride 0.9 % 500 mL IVPB     2,000 mg 250 mL/hr over 120 Minutes Intravenous  Once 05/26/16 1719 05/26/16 2056   05/26/16 0200  vancomycin (VANCOCIN) IVPB 1000 mg/200 mL premix  Status:  Discontinued     1,000 mg 200 mL/hr over 60 Minutes Intravenous Every 8 hours 05/26/16 1719 05/26/16 1951        Intake/Output Summary (Last 24 hours) at 05/28/16 1926 Last data filed at 05/28/16 1359  Gross per 24 hour  Intake 2405.17 ml  Output     50 ml  Net 2355.17 ml   Filed Weights   05/26/16 1630  Weight: 122.471 kg (270 lb)    Objective: Physical Exam: Filed Vitals:   05/28/16 0826 05/28/16 1009 05/28/16 1356 05/28/16 1823  BP:  121/71 129/76 104/68  Pulse:  86 83 96  Temp:  98.4 F (36.9 C) 98.1 F (36.7 C) 98.8 F (37.1 C)  TempSrc:  Oral Oral Oral  Resp:  19 19 18   Height:      Weight:      SpO2: 98% 94% 98% 98%    General: Alert, Awake and Oriented to Time, Place and Person. Appear in  mild distress Eyes: PERRL, Conjunctiva normal ENT: Oral Mucosa clear moist. Neck: no JVD, no Abnormal Mass Or lumps Cardiovascular: S1 and S2 Present, no Murmur, Respiratory: Bilateral Air entry equal and Decreased, Clear to Auscultation, no Crackles, no wheezes Abdomen: Bowel Sound present, Soft and no tenderness Skin: no redness, no Rash  Extremities: no Pedal edema, no calf tenderness Neurologic: Grossly no focal neuro deficit. Bilaterally Equal motor strength  Data Reviewed: CBC:  Recent Labs Lab 05/26/16 1929 05/27/16 0451  WBC 9.7 7.8  NEUTROABS 5.5  --   HGB 15.6 14.8  HCT 49.6 46.9    MCV 90.7 90.0  PLT 253 234   Basic Metabolic Panel:  Recent Labs Lab 05/26/16 1929 05/27/16 0451  NA 138 138  K 3.9 4.1  CL 105 104  CO2 24 25  GLUCOSE 98 92  BUN 6 8  CREATININE 0.89 0.86  CALCIUM 9.5 9.3    Liver Function Tests:  Recent Labs Lab 05/26/16 1929  AST 33  ALT 62  ALKPHOS 62  BILITOT 0.7  PROT 7.1  ALBUMIN 4.3   No results for input(s): LIPASE, AMYLASE in the last 168 hours. No results for input(s): AMMONIA in the last 168 hours. Coagulation Profile:  Recent Labs Lab 05/26/16 1929  INR 1.07   Cardiac Enzymes: No results for input(s): CKTOTAL, CKMB, CKMBINDEX, TROPONINI in the last 168 hours. BNP (last 3 results) No results for input(s): PROBNP in the last 8760 hours.  CBG: No results for input(s): GLUCAP in the last 168 hours.  Studies: No results found.   Scheduled Meds: . albuterol  2.5 mg Nebulization BID  . cyclobenzaprine  7.5 mg Oral TID  . enoxaparin (LOVENOX) injection  60 mg Subcutaneous Q24H  . gabapentin  900 mg Oral Q8H  . vancomycin  1,250 mg Intravenous Q8H  . vancomycin  125 mg Oral QID   Continuous Infusions: . lactated ringers 10 mL/hr at 05/28/16 0424   PRN Meds: acetaminophen **OR** acetaminophen, albuterol, hydrALAZINE, HYDROmorphone (DILAUDID) injection, ondansetron **OR** ondansetron (ZOFRAN) IV, oxyCODONE  Time spent: 30 minutes  Author: Lynden OxfordPranav Vollie Brunty, MD [facility] Pager: [phone] 05/28/2016 7:26 PM  If 7PM-7AM, please contact night-coverage at [email or web address], password [email or web address]

## 2016-05-28 NOTE — OR Nursing (Signed)
Addendum created to correct Out of Recovery time

## 2016-05-28 NOTE — Progress Notes (Signed)
Subjective: 1 Day Post-Op Procedure(s) (LRB): IRRIGATION AND DEBRIDEMENT OF HAND (Left) APPLICATION OF WOUND VAC (Left) Patient reports pain as controlled on current treatment plan.    Objective: Vital signs in last 24 hours: Temp:  [97.9 F (36.6 C)-98.8 F (37.1 C)] 98.4 F (36.9 C) (07/05 1009) Pulse Rate:  [73-86] 86 (07/05 1009) Resp:  [10-20] 19 (07/05 1009) BP: (118-154)/(66-100) 121/71 mmHg (07/05 1009) SpO2:  [94 %-100 %] 94 % (07/05 1009)  Intake/Output from previous day: 07/04 0701 - 07/05 0700 In: 2425.2 [P.O.:780; I.V.:1145.2; IV Piggyback:500] Out: 50 [Drains:50] Intake/Output this shift: Total I/O In: 240 [P.O.:240] Out: -    Recent Labs  05/26/16 1929 05/27/16 0451  HGB 15.6 14.8    Recent Labs  05/26/16 1929 05/27/16 0451  WBC 9.7 7.8  RBC 5.47 5.21  HCT 49.6 46.9  PLT 253 234    Recent Labs  05/26/16 1929 05/27/16 0451  NA 138 138  K 3.9 4.1  CL 105 104  CO2 24 25  BUN 6 8  CREATININE 0.89 0.86  GLUCOSE 98 92  CALCIUM 9.5 9.3    Recent Labs  05/26/16 1929  INR 1.07    dressing clean/dry/intact.  moving digits better.  no proximal streaking.  Assessment/Plan: 1 Day Post-Op Procedure(s) (LRB): IRRIGATION AND DEBRIDEMENT OF HAND (Left) APPLICATION OF WOUND VAC (Left) Continue iv abx.  Cultures pending.  Will start vac changes in 1-2 days.  Natassia Guthridge R 05/28/2016, 1:06 PM

## 2016-05-28 NOTE — Care Management Note (Addendum)
Case Management Note  Patient Details  Name: Derrick Rivers MRN: [id number] Date of Birth: [birth year]  Subjective/Objective:                    Action/Plan: 05-27-16 Incision and drainage of left hand wound including sharp,removal of skin and subcutaneous tissues with the knife and placement of wound VAC.  VAC application placed on shadow chart .   VAC dressing changes to start in 2 to 3 days per note . Will continue to follow.   [facility] [phone] . Called same spoke to [facility] updated on patient condition.   If patient needs VAC at discharge [facility] [address] [facility] [place] .   To arrange this call [facility] back. Please let NCM know ASAP .   Left voice mail for [name] with [employer] to inquire about process to [name].  [facility] needs [facility] at [facility] and [facility] [address] [facility] , [facility] .   Thanks [name] RN BSN [phone]   Expected Discharge Date:                  Expected Discharge Plan:  Corrections Facility  In-House Referral:     Discharge planning Services  CM Consult  Post Acute Care Choice:    Choice offered to:     DME Arranged:    DME Agency:     HH Arranged:    HH Agency:     Status of Service:  In process, will continue to follow  If discussed at Long Length of Stay Meetings, dates discussed:    Additional Comments:  Kingsley PlanWile, Derrick Kuennen Marie, RN 05/28/2016, 10:55 AM

## 2016-05-29 LAB — BASIC METABOLIC PANEL
Anion gap: 6 (ref 5–15)
BUN: 7 mg/dL (ref 6–20)
CALCIUM: 9 mg/dL (ref 8.9–10.3)
CO2: 25 mmol/L (ref 22–32)
CREATININE: 0.71 mg/dL (ref 0.61–1.24)
Chloride: 108 mmol/L (ref 101–111)
Glucose, Bld: 92 mg/dL (ref 65–99)
Potassium: 5.2 mmol/L — ABNORMAL HIGH (ref 3.5–5.1)
SODIUM: 139 mmol/L (ref 135–145)

## 2016-05-29 LAB — CBC
HCT: 40.8 % (ref 39.0–52.0)
Hemoglobin: 13.1 g/dL (ref 13.0–17.0)
MCH: 29.2 pg (ref 26.0–34.0)
MCHC: 32.1 g/dL (ref 30.0–36.0)
MCV: 90.9 fL (ref 78.0–100.0)
PLATELETS: 189 10*3/uL (ref 150–400)
RBC: 4.49 MIL/uL (ref 4.22–5.81)
RDW: 14.5 % (ref 11.5–15.5)
WBC: 5.5 10*3/uL (ref 4.0–10.5)

## 2016-05-29 LAB — CREATININE, SERUM
CREATININE: 0.85 mg/dL (ref 0.61–1.24)
GFR calc Af Amer: >60 mL/min (ref >60)
GFR calc non Af Amer: >60 mL/min (ref >60)

## 2016-05-29 MED ORDER — NALOXONE HCL 0.4 MG/ML IJ SOLN: 0.4000 mg | INTRAMUSCULAR | Status: DC | PRN

## 2016-05-29 MED ORDER — HYDROMORPHONE HCL 1 MG/ML IJ SOLN
0.5000 mg | INTRAMUSCULAR | Status: DC | PRN
  Filled 2016-05-29: qty 1

## 2016-05-29 MED ORDER — SODIUM CHLORIDE 0.9% FLUSH: 9.0000 mL | INTRAVENOUS | Status: DC | PRN

## 2016-05-29 MED ORDER — OXYCODONE-ACETAMINOPHEN 5-325 MG PO TABS
1.0000 | ORAL_TABLET | ORAL | Status: DC | PRN
  Administered 2016-05-29 (×2): 2 via ORAL
  Administered 2016-05-29: 1 via ORAL
  Administered 2016-05-29 – 2016-05-30 (×3): 2 via ORAL
  Filled 2016-05-29 (×3): qty 2
  Filled 2016-05-29 (×3): qty 1
  Filled 2016-05-29 (×3): qty 2

## 2016-05-29 MED ORDER — HYDROMORPHONE 1 MG/ML IV SOLN
INTRAVENOUS | Status: DC
  Filled 2016-05-29: qty 25

## 2016-05-29 MED ORDER — DIPHENHYDRAMINE HCL 12.5 MG/5ML PO ELIX: 12.5000 mg | ORAL_SOLUTION | Freq: Four times a day (QID) | ORAL | Status: DC | PRN

## 2016-05-29 MED ORDER — NALOXONE HCL 0.4 MG/ML IJ SOLN
0.4000 mg | INTRAMUSCULAR | Status: DC | PRN
Start: 2016-05-29 — End: 2016-05-30

## 2016-05-29 MED ORDER — DIPHENHYDRAMINE HCL 50 MG/ML IJ SOLN: 12.5000 mg | Freq: Four times a day (QID) | INTRAMUSCULAR | Status: DC | PRN

## 2016-05-29 MED ORDER — SODIUM CHLORIDE 0.9% FLUSH
9.0000 mL | INTRAVENOUS | Status: DC | PRN
Start: 2016-05-29 — End: 2016-05-30

## 2016-05-29 MED ORDER — CYCLOBENZAPRINE HCL 10 MG PO TABS
10.0000 mg | ORAL_TABLET | Freq: Three times a day (TID) | ORAL | Status: DC
  Administered 2016-05-29 – 2016-06-02 (×13): 10 mg via ORAL
  Filled 2016-05-29 (×5): qty 1
  Filled 2016-05-29: qty 2
  Filled 2016-05-29 (×7): qty 1
  Filled 2016-05-29: qty 2

## 2016-05-29 MED ORDER — NAPROXEN 250 MG PO TABS: 500.0000 mg | ORAL_TABLET | Freq: Three times a day (TID) | ORAL | Status: DC

## 2016-05-29 MED ORDER — HYDROMORPHONE HCL 1 MG/ML IJ SOLN
1.5000 mg | Freq: Once | INTRAMUSCULAR | Status: AC | PRN
  Administered 2016-05-29: 1.5 mg via INTRAVENOUS
  Filled 2016-05-29: qty 2

## 2016-05-29 MED ORDER — KETOROLAC TROMETHAMINE 30 MG/ML IJ SOLN
30.0000 mg | Freq: Four times a day (QID) | INTRAMUSCULAR | Status: DC
  Administered 2016-05-29 – 2016-05-30 (×4): 30 mg via INTRAVENOUS
  Filled 2016-05-29 (×4): qty 1

## 2016-05-29 MED ORDER — VITAMIN C 500 MG PO TABS
1000.0000 mg | ORAL_TABLET | Freq: Every day | ORAL | Status: DC
  Administered 2016-05-29 – 2016-06-02 (×5): 1000 mg via ORAL
  Filled 2016-05-29 (×5): qty 2

## 2016-05-29 MED ORDER — HYDROMORPHONE 1 MG/ML IV SOLN
INTRAVENOUS | Status: DC
  Administered 2016-05-29: 13:00:00 via INTRAVENOUS

## 2016-05-29 MED ORDER — HYDROMORPHONE 1 MG/ML IV SOLN: INTRAVENOUS | Status: DC

## 2016-05-29 MED ORDER — DIPHENHYDRAMINE HCL 12.5 MG/5ML PO ELIX
12.5000 mg | ORAL_SOLUTION | Freq: Four times a day (QID) | ORAL | Status: DC | PRN
  Administered 2016-05-29: 12.5 mg via ORAL
  Filled 2016-05-29: qty 10

## 2016-05-29 MED ORDER — OXYCODONE HCL 5 MG PO TABS
5.0000 mg | ORAL_TABLET | ORAL | Status: DC | PRN
  Administered 2016-05-29 – 2016-05-30 (×3): 5 mg via ORAL
  Filled 2016-05-29 (×3): qty 1

## 2016-05-29 NOTE — Progress Notes (Signed)
Received call from Dr. Allena KatzPatel stating that pt WILL need wound VAC upon dc.  [facility] and left message for [place], [place].  Anticipated dc date Monday, July 10.    Quintella BatonJulie W. Guido Comp, RN, BSN  Trauma/Neuro ICU Case Manager [phone]

## 2016-05-29 NOTE — Progress Notes (Signed)
Spoke with Loletha Carrowandi Hinson [phone]) regarding prison placement at dc with medical needs.  She states [facility] [address] [facility] [place], pending bed availability.  Will need to fax completed [employer] Auth form to Normajean Glasgowina Roach [facility] [employer] (fax # [phone]).  Pt's prison # is [id number], and needs to be included on all correspondence.    On date of dc,attending MD will need to call report to Prison MD.  Phone # is [phone] or [phone].  DC summary will need to be faxed to [phone].    Should pt need IV antibiotics at dc, please notify Loletha CarrowCandi Hinson at above # as soon as possible to assure that [address] [facility] can accommodate.    Case Manager will continue to follow/assist with transition to medical prison facility.    Quintella BatonJulie W. Alyissa Whidbee, RN, BSN  Trauma/Neuro ICU Case Manager [phone]

## 2016-05-29 NOTE — Progress Notes (Addendum)
Patient ID: Derrick Rivers, male   DOB: 06-12-1981, 35 y.o.   MRN: [id number]         [facility]    Date of Admission:  05/26/2016    Day 3 IV vancomycin        Day 2 oral vancomycin  Operative cultures of his left hand have been reintubated for better growth. I will continue IV vancomycin for now and wait on final cultures to determine optimal antibiotic therapy that he will need after discharge.         Cliffton AstersJohn Zena Vitelli, MD [facility] [phone] pager   [phone] 11/27/2015, 1:32 PM

## 2016-05-29 NOTE — Progress Notes (Addendum)
[facility] Progress Note  Patient: Derrick Rivers ZOX:[id number]   PCP: Pcp Not In System DOB: 11/02/1981   DOA: 05/26/2016   DOS: 05/29/2016   Date of Service: the patient was seen and examined on 05/29/2016  Subjective: patient mentions this pain is not-controlled.denies any chest pain or abdominal pain. Nausea and vomiting. Diarrhea is getting better. No fever Nutrition: Tolerating oral diet  Brief hospital course: Pt. with PMH of HTN; admitted on 05/26/2016, with complaint of swelling of the left hand, was found to have cellulitis. Patient underwent incision and drainage by hand surgery. Infectious diseases was also consulted. Currently further plan is continue IV antibiotics.  Assessment and Plan: 1.Recurrent Abscess of left hand. -s/p I&D x3 -initial wound cx was negative, last month Coag neg staph -on IV vancomycin, status post IandD, wound VAC placed -ID Dr.Campbell consulted -follow OR cultures - discussed with hand surgery who will prefer the patient to discharge with wound VAC, case management consulted [facility] VAC [facility].  2. Elevated BP -no h/o HTN, likely related to #1 -improved  3. C diff colitis -frequent loose stools for few days and multiple Abx exposures - C. difficile PCR Ag positive and toxin negative, due to profuse diarrhea will Rx with PO Vanc  4. Tobacco use. -counseled  5. Pain management. Patient required 10.5 mg of IV Dilaudid in 24 hours. Patient also used 70 mg of OxyIR. With such high dose patient is at high risk for adverse reaction. also such significant high dose in a patient was not on prescription narcotics 1 month ago is not indicated. PCA was chosen as a safer method. Minimize the use of IV bolus. IV Toradol scheduled 30 mg was also added. Flexeril dose was increased to 10 mg 3 times a day. Patient's OxyIR was changed to Percocet with increased frequency. Patient's IV has been infiltrated and  therefore patient will receive oral OxyIR 5 mg every 3 hours until IV has been reestablished. We will calculate patient's oral requirement and try to minimize narcotic before discharge. Expect in significant improvement in pain with improvement in patient's swelling. Patient understand that he will not be discharged on significant high dose of narcotics. If unable to obtain peripheral IV then pt will be transitioned to oral mscontin 40 mg bid with percocet at current regimen if orthopedic agrees.  Activity: No indication at present physical therapy Bowel regimen: last BM 05/29/2016 Diet: Regular diet DVT Prophylaxis: subcutaneous Heparin  Advance goals of care discussion: Full code  Family Communication: no family was present at bedside, at the time of interview.   Disposition:  [facility]. Expected discharge date: 06/02/2016.  Consultants: Orthopedics, infectious disease Procedures: Incision and debridement with wound VAC placement  Antibiotics: Anti-infectives    Start     Dose/Rate Route Frequency Ordered Stop   05/28/16 1000  vancomycin (VANCOCIN) 50 mg/mL oral solution 125 mg     125 mg Oral 4 times daily 05/28/16 0750 06/11/16 0959   05/27/16 2000  vancomycin (VANCOCIN) 1,250 mg in sodium chloride 0.9 % 250 mL IVPB     1,250 mg 166.7 mL/hr over 90 Minutes Intravenous Every 8 hours 05/27/16 1312     05/27/16 0200  vancomycin (VANCOCIN) IVPB 1000 mg/200 mL premix  Status:  Discontinued     1,000 mg 200 mL/hr over 60 Minutes Intravenous Every 8 hours 05/26/16 1952 05/27/16 1312   05/26/16 1730  vancomycin (VANCOCIN) 2,000 mg in sodium chloride  0.9 % 500 mL IVPB     2,000 mg 250 mL/hr over 120 Minutes Intravenous  Once 05/26/16 1719 05/26/16 2056   05/26/16 0200  vancomycin (VANCOCIN) IVPB 1000 mg/200 mL premix  Status:  Discontinued     1,000 mg 200 mL/hr over 60 Minutes Intravenous Every 8 hours 05/26/16 1719 05/26/16 1951        Intake/Output Summary  (Last 24 hours) at 05/29/16 1850 Last data filed at 05/29/16 1844  Gross per 24 hour  Intake   1940 ml  Output     25 ml  Net   1915 ml   Filed Weights   05/26/16 1630  Weight: 122.471 kg (270 lb)    Objective: Physical Exam: Filed Vitals:   05/28/16 2229 05/29/16 0633 05/29/16 1420 05/29/16 1839  BP: 139/75 121/78 133/79 125/80  Pulse: 74 66 68 75  Temp: 98.4 F (36.9 C) 98.1 F (36.7 C) 98.1 F (36.7 C) 98 F (36.7 C)  TempSrc: Oral Oral Oral Oral  Resp: 19 18 18 18   Height:      Weight:      SpO2: 96% 98% 98% 90%    General: Alert, Awake and Oriented to Time, Place and Person. Appear in mild distress Eyes: PERRL, Conjunctiva normal ENT: Oral Mucosa clear moist. Neck: no JVD, no Abnormal Mass Or lumps Cardiovascular: S1 and S2 Present, no Murmur, Respiratory: Bilateral Air entry equal and Decreased, Clear to Auscultation, no Crackles, no wheezes Abdomen: Bowel Sound present, Soft and no tenderness Skin: no redness, no Rash  Extremities: no Pedal edema, no calf tenderness Neurologic: Grossly no focal neuro deficit. Bilaterally Equal motor strength  Data Reviewed: CBC:  Recent Labs Lab 05/26/16 1929 05/27/16 0451 05/29/16 1532  WBC 9.7 7.8 5.5  NEUTROABS 5.5  --   --   HGB 15.6 14.8 13.1  HCT 49.6 46.9 40.8  MCV 90.7 90.0 90.9  PLT 253 234 189   Basic Metabolic Panel:  Recent Labs Lab 05/26/16 1929 05/27/16 0451 05/29/16 0402 05/29/16 1532  NA 138 138 139  --   K 3.9 4.1 5.2*  --   CL 105 104 108  --   CO2 24 25 25   --   GLUCOSE 98 92 92  --   BUN 6 8 7   --   CREATININE 0.89 0.86 0.71 0.85  CALCIUM 9.5 9.3 9.0  --     Liver Function Tests:  Recent Labs Lab 05/26/16 1929  AST 33  ALT 62  ALKPHOS 62  BILITOT 0.7  PROT 7.1  ALBUMIN 4.3   No results for input(s): LIPASE, AMYLASE in the last 168 hours. No results for input(s): AMMONIA in the last 168 hours. Coagulation Profile:  Recent Labs Lab 05/26/16 1929  INR 1.07   Cardiac  Enzymes: No results for input(s): CKTOTAL, CKMB, CKMBINDEX, TROPONINI in the last 168 hours. BNP (last 3 results) No results for input(s): PROBNP in the last 8760 hours.  CBG: No results for input(s): GLUCAP in the last 168 hours.  Studies: No results found.   Scheduled Meds: . cyclobenzaprine  10 mg Oral TID  . enoxaparin (LOVENOX) injection  60 mg Subcutaneous Q24H  . gabapentin  900 mg Oral Q8H  . HYDROmorphone   Intravenous Q4H  . ketorolac  30 mg Intravenous Q6H  . vancomycin  1,250 mg Intravenous Q8H  . vancomycin  125 mg Oral QID  . vitamin C  1,000 mg Oral Daily   Continuous Infusions:   PRN Meds:  acetaminophen **OR** acetaminophen, albuterol, diphenhydrAMINE **OR** diphenhydrAMINE, hydrALAZINE, naloxone **AND** sodium chloride flush, naloxone **AND** sodium chloride flush, naloxone **AND** sodium chloride flush, ondansetron **OR** ondansetron (ZOFRAN) IV, oxyCODONE, oxyCODONE-acetaminophen  Time spent: 30 minutes  Author: Lynden Oxford, MD [facility] Pager: [phone] 05/29/2016 6:50 PM  If 7PM-7AM, please contact night-coverage at [email or web address], password [facility]

## 2016-05-29 NOTE — Progress Notes (Signed)
Dr. Allena KatzPatel wanted a PowerGlide inserted for IV access however no one is on duty that is trained on this procedure.  Clayborne DanaPatti, primary RN made aware.   She will notify MD.

## 2016-05-30 LAB — CBC
HCT: 41.9 % (ref 39.0–52.0)
HEMOGLOBIN: 12.8 g/dL — AB (ref 13.0–17.0)
MCH: 27.8 pg (ref 26.0–34.0)
MCHC: 30.5 g/dL (ref 30.0–36.0)
MCV: 90.9 fL (ref 78.0–100.0)
Platelets: 191 10*3/uL (ref 150–400)
RBC: 4.61 MIL/uL (ref 4.22–5.81)
RDW: 14.4 % (ref 11.5–15.5)
WBC: 5.5 10*3/uL (ref 4.0–10.5)

## 2016-05-30 MED ORDER — DOXYCYCLINE HYCLATE 100 MG PO TABS
100.0000 mg | ORAL_TABLET | Freq: Two times a day (BID) | ORAL | Status: DC
  Administered 2016-05-30 – 2016-06-02 (×7): 100 mg via ORAL
  Filled 2016-05-30 (×7): qty 1

## 2016-05-30 MED ORDER — PANTOPRAZOLE SODIUM 40 MG PO TBEC
40.0000 mg | DELAYED_RELEASE_TABLET | Freq: Every day | ORAL | Status: DC
  Filled 2016-05-30 (×4): qty 1

## 2016-05-30 MED ORDER — OXYCODONE-ACETAMINOPHEN 5-325 MG PO TABS
1.0000 | ORAL_TABLET | ORAL | Status: DC | PRN
  Administered 2016-05-30 – 2016-06-02 (×18): 1 via ORAL
  Filled 2016-05-30 (×18): qty 1

## 2016-05-30 MED ORDER — MORPHINE SULFATE ER 15 MG PO TBCR: 45.0000 mg | EXTENDED_RELEASE_TABLET | Freq: Two times a day (BID) | ORAL | Status: DC

## 2016-05-30 MED ORDER — GABAPENTIN 400 MG PO CAPS
1200.0000 mg | ORAL_CAPSULE | Freq: Three times a day (TID) | ORAL | Status: DC
  Administered 2016-05-30 – 2016-06-02 (×10): 1200 mg via ORAL
  Filled 2016-05-30 (×10): qty 3

## 2016-05-30 MED ORDER — OXYCODONE HCL 5 MG PO TABS
5.0000 mg | ORAL_TABLET | ORAL | Status: DC | PRN
  Administered 2016-05-30: 5 mg via ORAL
  Filled 2016-05-30: qty 1

## 2016-05-30 MED ORDER — AMOXICILLIN-POT CLAVULANATE 500-125 MG PO TABS
1.0000 | ORAL_TABLET | Freq: Three times a day (TID) | ORAL | Status: DC
  Administered 2016-05-30 – 2016-06-02 (×9): 500 mg via ORAL
  Filled 2016-05-30 (×10): qty 1

## 2016-05-30 MED ORDER — MORPHINE SULFATE ER 15 MG PO TBCR: 30.0000 mg | EXTENDED_RELEASE_TABLET | Freq: Two times a day (BID) | ORAL | Status: DC

## 2016-05-30 MED ORDER — OXYCODONE HCL 5 MG PO TABS
5.0000 mg | ORAL_TABLET | ORAL | Status: DC | PRN
  Administered 2016-05-30 – 2016-06-02 (×18): 5 mg via ORAL
  Filled 2016-05-30 (×18): qty 1

## 2016-05-30 MED ORDER — OXYCODONE-ACETAMINOPHEN 5-325 MG PO TABS
1.0000 | ORAL_TABLET | ORAL | Status: DC | PRN
  Administered 2016-05-30: 2 via ORAL
  Filled 2016-05-30: qty 2

## 2016-05-30 MED ORDER — HYDROMORPHONE HCL 1 MG/ML IJ SOLN
0.5000 mg | Freq: Every day | INTRAMUSCULAR | Status: DC | PRN
  Administered 2016-05-30 – 2016-06-02 (×2): 1 mg via INTRAVENOUS
  Filled 2016-05-30: qty 1
  Filled 2016-05-30: qty 2

## 2016-05-30 NOTE — Progress Notes (Signed)
1.5mg  iv dilaudid ordered and administered for dressing change

## 2016-05-30 NOTE — Progress Notes (Signed)
10/325mg  percocet administered for pain. Medication did not scan

## 2016-05-30 NOTE — Progress Notes (Signed)
Patient ID: Derrick Rivers, male   DOB: 02/05/1981, 35 y.o.   MRN: [id number]         [facility]    Date of Admission:  05/26/2016    Day 4 IV vancomycin        Day 3 oral vancomycin  Operative cultures have grown normal skin flora. He is having problems with IV access. I doubt that the primary problem with his poor wound healing is due to ongoing, deep infection. I will change the IV vancomycin over to oral doxycycline and amoxicillin clavulanate. I recommend another 7 days of therapy. I would continue his oral vancomycin while here but stopping it on discharge. I told him that I believe the most likely way to get this wound healed is to continue using the wound VAC. I will sign off now.    Cliffton AstersJohn Deyonte Cadden, MD [facility] [phone] pager   [phone] cell 11/27/2015, 1:32 PM

## 2016-05-30 NOTE — Progress Notes (Signed)
Pt. Refused change of linens

## 2016-05-30 NOTE — Progress Notes (Signed)
Pt disconnected his wound vac line stating that it has a leak in it. No leak alarm sounds heard. Pt also says he will not let nursing reconnect it until he speaks to the MD. hospitalist paged

## 2016-05-30 NOTE — Progress Notes (Signed)
[facility] Progress Note  Patient: Derrick Rivers WGN:[id number]   PCP: Pcp Not In System DOB: 08/08/1981   DOA: 05/26/2016   DOS: 05/30/2016   Date of Service: the patient was seen and examined on 05/30/2016  Subjective: Patient is concerned regarding not receiving IV pain medication. Patient mentions his pain is a burning-like pain with throbbing. Nausea and vomiting. Patient has been seen ambulating in the room multiple times. Patient is also found to be discontinuing wound VAC tubing on and off. Nutrition: Tolerating oral diet  Brief hospital course: Pt. with PMH of HTN; admitted on 05/26/2016, with complaint of swelling of the left hand, was found to have cellulitis. Patient underwent incision and drainage by hand surgery. Infectious diseases was also consulted. Currently further plan is arranged for transfer to [facility] present  Assessment and Plan: 1.Recurrent Abscess of left hand. -s/p I&D x3 -initial wound cx was negative, last month Coag neg staph -on IV vancomycin, status post IandD, wound VAC placed -ID Dr.Campbell consulted -Negative OR cultures, transitioned to oral Augmentin and doxycycline last day 06/06/2016. - discussed with hand surgery who will prefer the patient to discharge with wound VAC, case management consulted to arrange for transfer to [facility], for wound VAC management.  2. Elevated BP -no h/o HTN, likely related to #1 -improved  3. C diff colitis -frequent loose stools for few days and multiple Abx exposures - C. difficile PCR Ag positive and toxin negative, due to profuse diarrhea will Rx with PO Vanc  4. Tobacco use. -counseled  5. Pain management. Patient mentions he has high pain tolerance. Patient is seen in the hospital multiple times with abscess of the left hand. On review of patient's chart patient receives IV Dilaudid while in the hospital at significantly higher dose and frequency and gets discharged on oral Percocet or  tramadol. Patient mentions outside of the hospital his pain is controlled. Based on this finding, based on patient's normal heart rate as well as blood pressure while complaining 10 out of 10 pain as well as evidence of well-controlled pain outside of the hospital despite requiring high-dose narcotic in the hospital I feel that the patient does not require IV narcotics. This was discussed with orthopedic Dr. and they agreed with the assessment and recommended to use IV narcotics only with VAC change. We will continue Percocet only. Recommend judicious use of IV narcotics in the future admissions.  Activity: No indication at present physical therapy Bowel regimen: last BM 05/29/2016 Diet: Regular diet DVT Prophylaxis: subcutaneous Heparin  Advance goals of care discussion: Full code  Family Communication: no family was present at bedside, at the time of interview.   Disposition:  [facility] to prison. Expected discharge date: 05/31/2016. Pending bed availability  Consultants: Orthopedics, infectious disease Procedures: Incision and debridement with wound VAC placement  Antibiotics: Anti-infectives    Start     Dose/Rate Route Frequency Ordered Stop   05/30/16 1300  amoxicillin-clavulanate (AUGMENTIN) 500-125 MG per tablet 500 mg     1 tablet Oral Every 8 hours 05/30/16 1138     05/30/16 1300  doxycycline (VIBRA-TABS) tablet 100 mg     100 mg Oral Every 12 hours 05/30/16 1138     05/28/16 1000  vancomycin (VANCOCIN) 50 mg/mL oral solution 125 mg     125 mg Oral 4 times daily 05/28/16 0750 06/11/16 0959   05/27/16 2000  vancomycin (VANCOCIN) 1,250 mg in sodium chloride 0.9 % 250 mL IVPB  Status:  Discontinued  1,250 mg 166.7 mL/hr over 90 Minutes Intravenous Every 8 hours 05/27/16 1312 05/30/16 1137   05/27/16 0200  vancomycin (VANCOCIN) IVPB 1000 mg/200 mL premix  Status:  Discontinued     1,000 mg 200 mL/hr over 60 Minutes Intravenous Every 8 hours 05/26/16 1952 05/27/16  1312   05/26/16 1730  vancomycin (VANCOCIN) 2,000 mg in sodium chloride 0.9 % 500 mL IVPB     2,000 mg 250 mL/hr over 120 Minutes Intravenous  Once 05/26/16 1719 05/26/16 2056   05/26/16 0200  vancomycin (VANCOCIN) IVPB 1000 mg/200 mL premix  Status:  Discontinued     1,000 mg 200 mL/hr over 60 Minutes Intravenous Every 8 hours 05/26/16 1719 05/26/16 1951        Intake/Output Summary (Last 24 hours) at 05/30/16 1945 Last data filed at 05/30/16 1725  Gross per 24 hour  Intake   1540 ml  Output     50 ml  Net   1490 ml   Filed Weights   05/26/16 1630  Weight: 122.471 kg (270 lb)    Objective: Physical Exam: Filed Vitals:   05/29/16 1839 05/29/16 2100 05/30/16 0502 05/30/16 1514  BP: 125/80 142/80 131/92 152/97  Pulse: 75 82 69 63  Temp: 98 F (36.7 C) 98.2 F (36.8 C) 97.9 F (36.6 C) 97.5 F (36.4 C)  TempSrc: Oral Oral Oral Oral  Resp: 18 17 17 18   Height:      Weight:      SpO2: 90% 96% 98% 100%    General: Alert, Awake and Oriented to Time, Place and Person. Appear in mild distress Eyes: PERRL, Conjunctiva normal ENT: Oral Mucosa clear moist. Neck: no JVD, no Abnormal Mass Or lumps Cardiovascular: S1 and S2 Present, no Murmur, Respiratory: Bilateral Air entry equal and Decreased, Clear to Auscultation, no Crackles, no wheezes Abdomen: Bowel Sound present, Soft and no tenderness Skin: no redness, no Rash  Extremities: no Pedal edema, no calf tenderness Neurologic: Grossly no focal neuro deficit. Bilaterally Equal motor strength  Data Reviewed: CBC:  Recent Labs Lab 05/26/16 1929 05/27/16 0451 05/29/16 1532 05/30/16 0714  WBC 9.7 7.8 5.5 5.5  NEUTROABS 5.5  --   --   --   HGB 15.6 14.8 13.1 12.8*  HCT 49.6 46.9 40.8 41.9  MCV 90.7 90.0 90.9 90.9  PLT 253 234 189 191   Basic Metabolic Panel:  Recent Labs Lab 05/26/16 1929 05/27/16 0451 05/29/16 0402 05/29/16 1532  NA 138 138 139  --   K 3.9 4.1 5.2*  --   CL 105 104 108  --   CO2 24 25 25    --   GLUCOSE 98 92 92  --   BUN 6 8 7   --   CREATININE 0.89 0.86 0.71 0.85  CALCIUM 9.5 9.3 9.0  --     Liver Function Tests:  Recent Labs Lab 05/26/16 1929  AST 33  ALT 62  ALKPHOS 62  BILITOT 0.7  PROT 7.1  ALBUMIN 4.3   No results for input(s): LIPASE, AMYLASE in the last 168 hours. No results for input(s): AMMONIA in the last 168 hours. Coagulation Profile:  Recent Labs Lab 05/26/16 1929  INR 1.07   Cardiac Enzymes: No results for input(s): CKTOTAL, CKMB, CKMBINDEX, TROPONINI in the last 168 hours. BNP (last 3 results) No results for input(s): PROBNP in the last 8760 hours.  CBG: No results for input(s): GLUCAP in the last 168 hours.  Studies: No results found.   Scheduled Meds: .  amoxicillin-clavulanate  1 tablet Oral Q8H  . cyclobenzaprine  10 mg Oral TID  . doxycycline  100 mg Oral Q12H  . enoxaparin (LOVENOX) injection  60 mg Subcutaneous Q24H  . gabapentin  1,200 mg Oral TID  . [START ON 06/03/2016] naproxen  500 mg Oral TID WC  . pantoprazole  40 mg Oral QAC breakfast  . vancomycin  125 mg Oral QID  . vitamin C  1,000 mg Oral Daily   Continuous Infusions:   PRN Meds: acetaminophen **OR** acetaminophen, albuterol, hydrALAZINE, HYDROmorphone (DILAUDID) injection, ondansetron **OR** ondansetron (ZOFRAN) IV, oxyCODONE-acetaminophen **AND** oxyCODONE  Time spent: 30 minutes  Author: Lynden OxfordPranav Allien Melberg, MD [facility] Pager: [phone] 05/30/2016 7:45 PM  If 7PM-7AM, please contact night-coverage at [email or web address], password [email or web address]

## 2016-05-30 NOTE — Progress Notes (Signed)
According to day shift nurse they cannot get IV line by the IV team, I started an IV line and connected to PCA and informed pt of restart IV antibiotic .Accdg to him when he stood up it accidentally pulled out, however the officer called and said he pulled out the IV and disconnected the wound vac, he said he doesn't like the dilaudid drip I left message to on call MD if he can order different drip waiting for his response, wasted the dilaudid drip that was not been used, I placed second IV and instructed to leave it alone.

## 2016-05-30 NOTE — Care Management (Signed)
Spoke with Loletha Carrowandi Hinson UR  [phone] coordinator for prisoner assignments. [facility] will need wound measurements and [facility] [phone] . MD will need to put [facility] for amount of suction and directions on frequency of dressing changes in discharge summary.   Also if patient discharges on IV antibiotics Ms Steele BergHinson requesting MD provide prescription and note prescription also in discharge summary.   Ronny FlurryHeather [facility] Stfleur RN BSN [phone]

## 2016-05-30 NOTE — Consult Note (Signed)
Arrived to floor for dressing change and it was noted that this patient has disconnected his VAC dressing at least 2 times.  I went in to speak with the patient about this to determine why he has been disconnecting the dressing. He reports he can hear a leak and the machine alarmed leak and he decided to disconnect it for that reason.  I have explained that DC of the tubing is a direct portal for germs into the line, a disruption in therapy can complicate his wound healing and that once the seal is disrupted by disconnection that most often it can not be resealed without taking the dressing down completely.  I have requested that the patient agree verbally to not disconnect the VAC tubing any more if I change the dressing today.  Additionally during this conversation the patient reports his MD came in and said he would give orders for IV pain meds for his dressing change.  I have contacted the MD a second time, the bedside nurse paged about the same just prior to my arrival. New orders entered for IV pain meds, however it should be noted that when he is DC back to jail he will not be able to receive IV pain meds with dressing changes.   It has been requested by the jail facility to have measurements and supply list.  I will add this to my note once the dressing is changed today.    WOC wound consult note Reason for Consult: NPWT VAC dressing change, first post op dressing Wound type: surgical S/P debridement 05/27/16 Measurement: 4cm x 2cm x 1.0cm  Wound bed: clean, moist, pink  Drainage (amount, consistency, odor) some mild oozing at the distal end of the wound, sanguinous  Periwound: edematous, skin a little macerated  Dressing procedure/placement/frequency: Skin cleansed after dressing removed. VAC was not functioning when I arrived.   periwound skin protected with VAC drape, 1pc of black foam used to fill the wound bed, sealed with drape at 170mHG.  Pt. Tolerated well, did receive one does of IV  Dilaudid prior to the dressing change. Bedside nurse administered.  Patient continues to discuss pain control with WLohmannurse, I have explained that the MD will need to be the one he discusses this with. I tried to explain that he can not have IV meds for pain the the prison so that may be why they are trying to use PO meds only to control his pain, he references "his history" and that has "nothing to do with this pain".  I again referenced that his MD would be who he needs to discuss this with.   NPWT VAC dressing changes to be performed M/W/F. Supplies needed: NPWT machine, Small VAC dressing kit.  [place] will follow along with you for dressing changes until patient is DC. MStanton COlney

## 2016-05-30 NOTE — Care Management (Addendum)
1355 [facility] [phone] . Ronny FlurryHeather Takira Sherrin RN BSN [phone]     Patient discharging on PO antibiotics .   Spoke to [employer]andi Hinson again.  [facility] . However, doctors are presently in rounds and may discharge someone. [facility] [phone] or [phone] and ask for Derrick Derrick SeltzerBaker PA to discuss case and be sure [name] ( Dr Allena KatzPatel aware and calling) . Once Derrick Rivers accepts patient , patient will be placed on a waiting list for a bed.  Patient will then be transferred once bed is available . [name] after hours and on weekends .   Once patient has a bed , bedside nurse will have to call report to  [facility]  Nurse [phone] or [phone], and discharge summary needs to be faxed to [phone] prior to patient being transported.  [facility] once available.  Ronny FlurryHeather Gerardine Peltz RN BSN [phone]

## 2016-05-30 NOTE — Progress Notes (Signed)
Subjective: 3 Days Post-Op Procedure(s) (LRB): IRRIGATION AND DEBRIDEMENT OF HAND (Left) APPLICATION OF WOUND VAC (Left) Patient reports pain as not completely controlled on current regimen.  Had wound vac change to left hand.    Objective: Vital signs in last 24 hours: Temp:  [97.5 F (36.4 C)-98.2 F (36.8 C)] 97.5 F (36.4 C) (07/07 1514) Pulse Rate:  [63-82] 63 (07/07 1514) Resp:  [17-18] 18 (07/07 1514) BP: (125-152)/(80-97) 152/97 mmHg (07/07 1514) SpO2:  [90 %-100 %] 100 % (07/07 1514)  Intake/Output from previous day: 07/06 0701 - 07/07 0700 In: 1420 [P.O.:1420] Out: 25 [Drains:25] Intake/Output this shift: Total I/O In: 600 [P.O.:600] Out: 50 [Drains:50]   Recent Labs  05/29/16 1532 05/30/16 0714  HGB 13.1 12.8*    Recent Labs  05/29/16 1532 05/30/16 0714  WBC 5.5 5.5  RBC 4.49 4.61  HCT 40.8 41.9  PLT 189 191    Recent Labs  05/29/16 0402 05/29/16 1532  NA 139  --   K 5.2*  --   CL 108  --   CO2 25  --   BUN 7  --   CREATININE 0.71 0.85  GLUCOSE 92  --   CALCIUM 9.0  --    No results for input(s): LABPT, INR in the last 72 hours.  Wound vac in place.  Decreased induration and erythema of distal skin.  No proximal streaking.  Assessment/Plan: 3 Days Post-Op Procedure(s) (LRB): IRRIGATION AND DEBRIDEMENT OF HAND (Left) APPLICATION OF WOUND VAC (Left) Continue wound vac.  Discussed pain control measures and medications with patient.  Would not recommend further iv pain medication at this point.  He states his pain was better controlled on regimen of percocet 10/325 q6 hours and breakthrough addition of oxycodone 5mg , which is the same total daily dosage.  Given previous pain medication usage, he likely has high tolerance to the medications.  Appreciate hospitalist assistance with management of medications and coordination of care.  Keilly Fatula R 05/30/2016, 6:08 PM

## 2016-05-31 DIAGNOSIS — A09 Infectious gastroenteritis and colitis, unspecified: Secondary | ICD-10-CM

## 2016-05-31 LAB — CULTURE, BLOOD (ROUTINE X 2)
Culture: NO GROWTH
Culture: NO GROWTH

## 2016-05-31 LAB — CBC
HEMATOCRIT: 44.8 % (ref 39.0–52.0)
HEMOGLOBIN: 13.8 g/dL (ref 13.0–17.0)
MCH: 27.5 pg (ref 26.0–34.0)
MCHC: 30.8 g/dL (ref 30.0–36.0)
MCV: 89.4 fL (ref 78.0–100.0)
Platelets: 172 10*3/uL (ref 150–400)
RBC: 5.01 MIL/uL (ref 4.22–5.81)
RDW: 14.1 % (ref 11.5–15.5)
WBC: 4.7 10*3/uL (ref 4.0–10.5)

## 2016-05-31 MED ORDER — OXYCODONE-ACETAMINOPHEN 5-325 MG PO TABS: 1.0000 | ORAL_TABLET | Freq: Four times a day (QID) | ORAL | Status: AC | PRN

## 2016-05-31 MED ORDER — FAMOTIDINE 20 MG PO TABS: 20.0000 mg | ORAL_TABLET | Freq: Two times a day (BID) | ORAL | Status: AC

## 2016-05-31 MED ORDER — CYCLOBENZAPRINE HCL 5 MG PO TABS: 10.0000 mg | ORAL_TABLET | Freq: Three times a day (TID) | ORAL | Status: DC | PRN

## 2016-05-31 MED ORDER — OXYCODONE HCL 5 MG PO TABS: 5.0000 mg | ORAL_TABLET | Freq: Four times a day (QID) | ORAL | Status: AC | PRN

## 2016-05-31 MED ORDER — AMLODIPINE BESYLATE 2.5 MG PO TABS: 2.5000 mg | ORAL_TABLET | Freq: Every day | ORAL | Status: AC

## 2016-05-31 MED ORDER — DOXYCYCLINE HYCLATE 100 MG PO TABS: 100.0000 mg | ORAL_TABLET | Freq: Two times a day (BID) | ORAL | Status: AC

## 2016-05-31 MED ORDER — AMOXICILLIN-POT CLAVULANATE 500-125 MG PO TABS: 1.0000 | ORAL_TABLET | Freq: Three times a day (TID) | ORAL | Status: DC

## 2016-05-31 MED ORDER — AMOXICILLIN-POT CLAVULANATE 500-125 MG PO TABS: 1.0000 | ORAL_TABLET | Freq: Three times a day (TID) | ORAL | Status: AC

## 2016-05-31 MED ORDER — VANCOMYCIN 50 MG/ML ORAL SOLUTION: 125.0000 mg | Freq: Four times a day (QID) | ORAL | Status: AC

## 2016-05-31 MED ORDER — CYCLOBENZAPRINE HCL 5 MG PO TABS: 5.0000 mg | ORAL_TABLET | Freq: Three times a day (TID) | ORAL | Status: AC | PRN

## 2016-05-31 NOTE — Discharge Summary (Addendum)
[facility] Discharge Summary   Patient: Derrick Rivers PQZ:[id number]   PCP: Pcp Not In System DOB: Feb 05, 1981   Date of admission: 05/26/2016   Date of discharge:  05/31/2016    the pt was discharged on 06/02/2016,   Discharge Diagnoses:  Principal Problem:   Abscess of left hand Active Problems:   Polysubstance abuse   HTN (hypertension)   Tobacco use disorder   Diarrhea  Admitted From: [place], Dr. Levell July office Disposition:  [facility], I called Mr Luana Shu [place], on 05/30/2016 who has accepted the pt in transfer.   Recommendations for Outpatient Follow-up:  1. Follow-up with Dr. Levell July office in one week  Recommend judicious use of IV narcotics in the future admissions.  Follow-up Information    Follow up with Tennis Must, MD. Schedule an appointment as soon as possible for a visit in 1 week.   Specialty:  Orthopedic Surgery   Why:  for [facility] changes   Contact information:   [place] [address] [phone]      Diet recommendation: Regular diet  Activity: The patient is advised to gradually reintroduce usual activities.  Discharge Condition: good  Code Status: Full code  History of present illness: As per the H and P dictated on admission, "Derrick Rivers is a 35 y.o. male with medical history significant of polysubstance abuse, currently inmate, presented from Dr. Levell July office for worsening left 10 pain swelling erythema. Mr Lafon reporting that he developed of infection over the dorsal aspect of his left hand about 3 weeks ago, coming progressively worse. This progressed to a left hand abscess for which Dr. Fredna Dow of orthopedic surgery performed incision and drainage on 05/01/2016. Over the past 3 days his left hand has become increasingly painful having significant erythema swelling. He states unable to flex or extend his fingers. He saw Dr. Fredna Dow today in the office he recommended readmitted to the hospital for further  evaluation and treatment. Mr. Chapple reports associated fevers, chills, nausea without vomiting. "  Hospital Course:  Summary of his active problems in the hospital is as following.  1. RecurrentAbscess of left hand. -s/p I&D and wound VAC placement on 05/27/2016 this admission. Prior I&D procedure on 05/08/2016, 05/01/2016, 04/15/2016. -initial wound cx was negative, last month Coag neg staph -on IV vancomycin, status post I&D, wound VAC placed -ID Dr.Campbell consulted -Negative OR cultures, transitioned to oral Augmentin and doxycycline last day 06/06/2016. -discussed with hand surgery who will prefer the patient to discharge with wound VAC, case management consulted to arrange for transfer to [facility], for wound VAC management.  Patient seen disconnecting VAC dressing on his own and recommended not to do it on his own and informed patient's RN regarding any issues that he identifies with the [facility] dressing.  Patient was clearly informed that removing any dressing on his without proper precaution own can cause further infection, and given his recurrent infection best way to heal his scar is wound VAC.  NPWT VAC dressing changes to be performed M/W/F. Supplies needed: NPWT machine, Small VAC dressing kit. 1pc of black foam used to fill the wound bed, sealed with drape at 125 mmHG.  2. Hypertension. Initiate Norvasc 2.5 mg daily.  3. C diff colitis -frequent loose stools for few days and multiple Abx exposures - C. difficile PCR Ag positive and toxin negative,  - due to profuse diarrhea will Rx with PO vancomycin, last day 06/11/2016.  4. Tobacco use. -counseled to quit smoking  5.  Pain management. Patient mentions he has high pain tolerance.  Patient is seen in the hospital multiple times with abscess of the left hand, undergoing frequent procedures. Based on this finding, based on patient's normal heart rate, and clinically in no apparent distress, while complaining 10 out  of 10 pain as well as evidence of well-controlled pain outside of the hospital despite requiring high-dose narcotic in the hospital I feel that the patient does not require IV narcotics. This was discussed with orthopedic Dr. Fredna Dow and they agreed with the assessment and recommended to use IV narcotics only with VAC change. We will continue Percocet only. New prescriptions were given since the patient is getting discharged to another facility.  Recommend judicious use of IV narcotics in the future admissions.  All other chronic medical condition were stable during the hospitalization.  Patient was ambulatory without any assistance. On the day of the discharge the patient's vitals were stable, and no other acute medical condition were reported by patient. the patient was felt safe to be discharge at [place] with wound VAC.  Procedures and Results: Incision and drainage of left hand wound including sharp removal of skin and subcutaneous tissues with the knife and placement of wound VAC. 05/27/2016  Consultations:  Hand surgery Dr. Fredna Dow  Infectious disease Dr. Bridget Hartshorn  DISCHARGE MEDICATION: Current Discharge Medication List    START taking these medications   Details  amLODipine (NORVASC) 2.5 MG tablet Take 1 tablet (2.5 mg total) by mouth daily. Qty: 30 tablet, Refills: 0    amoxicillin-clavulanate (AUGMENTIN) 500-125 MG tablet Take 1 tablet (500 mg total) by mouth every 8 (eight) hours. Qty: 18 tablet, Refills: 0    cyclobenzaprine (FLEXERIL) 5 MG tablet Take 1 tablet (5 mg total) by mouth 3 (three) times daily as needed for muscle spasms. Qty: 30 tablet, Refills: 0    doxycycline (VIBRA-TABS) 100 MG tablet Take 1 tablet (100 mg total) by mouth every 12 (twelve) hours. Qty: 12 tablet, Refills: 0    famotidine (PEPCID) 20 MG tablet Take 1 tablet (20 mg total) by mouth 2 (two) times daily. Qty: 30 tablet, Refills: 0    oxyCODONE (OXY IR/ROXICODONE) 5 MG immediate release  tablet Take 1 tablet (5 mg total) by mouth every 6 (six) hours as needed for moderate pain, severe pain or breakthrough pain. Qty: 20 tablet, Refills: 0    vancomycin (VANCOCIN) 50 mg/mL oral solution Take 2.5 mLs (125 mg total) by mouth 4 (four) times daily. Qty: 120 mL, Refills: 0      CONTINUE these medications which have CHANGED   Details  oxyCODONE-acetaminophen (PERCOCET/ROXICET) 5-325 MG tablet Take 1 tablet by mouth every 6 (six) hours as needed for severe pain. Qty: 20 tablet, Refills: 0      CONTINUE these medications which have NOT CHANGED   Details  gabapentin (NEURONTIN) 300 MG capsule Take 900 mg by mouth every 8 (eight) hours.     ibuprofen (ADVIL,MOTRIN) 600 MG tablet Take 600 mg by mouth every 6 (six) hours.       STOP taking these medications     cephALEXin (KEFLEX) 500 MG capsule      doxycycline (VIBRAMYCIN) 100 MG capsule        Allergies  Allergen Reactions  . Multihance [Gadobenate] Nausea And Vomiting    Questionable, per patient   Discharge Instructions    Change dressing (specify)    Complete by:  As directed   NPWT VAC dressing changes to be performed M/W/F. Supplies  needed: NPWT machine, Small VAC dressing kit. 1pc of black foam used to fill the wound bed, sealed with drape at 156mHG.     Diet general    Complete by:  As directed      Discharge instructions    Complete by:  As directed   It is important that you read following instructions as well as go over your medication list with RN to help you understand your care after this hospitalization.  Discharge Instructions: Please follow-up with PCP in one week Follow up with Dr DSharol Givenin   Please request your primary care physician to go over all Hospital Tests and Procedure/Radiological results at the follow up,  Please get all Hospital records sent to your PCP by signing hospital release before you go home.   Do not drive, operating heavy machinery, perform activities at heights, swimming  or participation in water activities or provide baby sitting services; until you have been seen by Primary Care Physician or a Neurologist and advised to do so again. Do not take more than prescribed Pain, Sleep and Anxiety Medications. You were cared for by a hospitalist during your hospital stay. If you have any questions about your discharge medications or the care you received while you were in the hospital after you are discharged, you can call the unit and ask to speak with the hospitalist on call if the hospitalist that took care of you is not available.  Once you are discharged, your primary care physician will handle any further medical issues. Please note that NO REFILLS for any discharge medications will be authorized once you are discharged, as it is imperative that you return to your primary care physician (or establish a relationship with a primary care physician if you do not have one) for your aftercare needs so that they can reassess your need for medications and monitor your lab values. You Must read complete instructions/literature along with all the possible adverse reactions/side effects for all the Medicines you take and that have been prescribed to you. Take any new Medicines after you have completely understood and accept all the possible adverse reactions/side effects. Wear Seat belts while driving. If you have smoked or chewed Tobacco in the last 2 yrs please stop smoking and/or stop any Recreational drug use.     Increase activity slowly    Complete by:  As directed           Discharge Exam: Filed Weights   05/26/16 1630  Weight: 122.471 kg (270 lb)   Filed Vitals:   05/30/16 2127 05/31/16 0504  BP: 153/84 151/87  Pulse: 72 63  Temp: 98.6 F (37 C) 98.2 F (36.8 C)  Resp: 18 18   General: Appear in no distress, no Rash; Oral Mucosa moist. Cardiovascular: S1 and S2 Present, no Murmur, no JVD Respiratory: Bilateral Air entry present and Clear to Auscultation, no  Crackles, no wheezes Abdomen: Bowel Sound present, Soft and no tenderness Extremities: no Pedal edema, no calf tenderness Neurology: Grossly no focal neuro deficit.  The results of significant diagnostics from this hospitalization (including imaging, microbiology, ancillary and laboratory) are listed below for reference.    Significant Diagnostic Studies: Mr Hand Left W Wo Contrast  05/08/2016  CLINICAL DATA:  Left hand pain and swelling. The patient developed an infection of the left hand approximately 4 weeks ago. Status post incision and drainage of the left hand times two, last on 05/01/2016. EXAM: MRI OF THE LEFT HAND WITHOUT AND WITH CONTRAST  TECHNIQUE: Multiplanar, multisequence MR imaging was performed both before and after administration of intravenous contrast. CONTRAST:  71m MULTIHANCE GADOBENATE DIMEGLUMINE 529 MG/ML IV SOLN COMPARISON:  Plain films left hand 04/15/2016 FINDINGS: Subcutaneous edema and enhancement are identified over the dorsum of the hand. There is a skin defect at approximately the level of the distal third metacarpal most consistent with site of prior incision and drainage. A rim enhancing fluid collection in the subcutaneous tissues over the dorsum of the hand measures 0.9 cm long by 0.3 cm AP by 0.5 cm transverse. The collection is between the second and third metacarpals at the level of the metaphysis of these bones. No other findings to suggest abscess is identified. All intrinsic musculature of the hand demonstrates normal signal in there is no intramuscular fluid collection. Bone marrow signal is normal throughout. IMPRESSION: Findings most consistent with cellulitis of the dorsal soft tissues of the left hand with a possible abscess measuring 0.9 x 0.3 x 0.5 cm between the distal metaphyses of the second and third metacarpals. There is no evidence of myositis or osteomyelitis. Electronically Signed   By: TInge RiseM.D.   On: 05/08/2016 15:36   Dg Hand  Complete Left  05/26/2016  CLINICAL DATA:  Cellulitis. Unable to extend fingers. Spider bite 1 month ago with soft tissue infection. EXAM: LEFT HAND - COMPLETE 3+ VIEW COMPARISON:  MRI 05/08/2016.  Radiography 04/15/2016. FINDINGS: Persistent or recurrent generalized soft tissue swelling. No sign of osteomyelitis or radiopaque foreign object. IMPRESSION: Persistent or recurrent generalized soft tissue swelling. Electronically Signed   By: MNelson ChimesM.D.   On: 05/26/2016 22:37    Microbiology: Recent Results (from the past 240 hour(s))  MRSA PCR Screening     Status: None   Collection Time: 05/26/16  5:23 PM  Result Value Ref Range Status   MRSA by PCR NEGATIVE NEGATIVE Final    Comment:        The GeneXpert MRSA Assay (FDA approved for NASAL specimens only), is one component of a comprehensive MRSA colonization surveillance program. It is not intended to diagnose MRSA infection nor to guide or monitor treatment for MRSA infections.   Culture, blood (routine x 2)     Status: None   Collection Time: 05/26/16  6:51 PM  Result Value Ref Range Status   Specimen Description BLOOD RIGHT ANTECUBITAL  Final   Special Requests BOTTLES DRAWN AEROBIC ONLY 5CC  Final   Culture NO GROWTH 5 DAYS  Final   Report Status 05/31/2016 FINAL  Final  Culture, blood (routine x 2)     Status: None   Collection Time: 05/26/16  6:55 PM  Result Value Ref Range Status   Specimen Description BLOOD LEFT HAND  Final   Special Requests BOTTLES DRAWN AEROBIC AND ANAEROBIC 5CC  Final   Culture NO GROWTH 5 DAYS  Final   Report Status 05/31/2016 FINAL  Final  C difficile quick scan w PCR reflex     Status: Abnormal   Collection Time: 05/26/16 10:36 PM  Result Value Ref Range Status   C Diff antigen POSITIVE (A) NEGATIVE Final   C Diff toxin NEGATIVE NEGATIVE Final   C Diff interpretation   Final    C. difficile present, but toxin not detected. This indicates colonization. In most cases, this does not require  treatment. If patient has signs and symptoms consistent with colitis, consider treatment. Requires ENTERIC precautions.  Surgical pcr screen     Status: None   Collection  Time: 05/27/16  3:17 PM  Result Value Ref Range Status   MRSA, PCR NEGATIVE NEGATIVE Final   Staphylococcus aureus NEGATIVE NEGATIVE Final    Comment:        The Xpert SA Assay (FDA approved for NASAL specimens in patients over 38 years of age), is one component of a comprehensive surveillance program.  Test performance has been validated by [facility] for patients greater than or equal to 66 year old. It is not intended to diagnose infection nor to guide or monitor treatment.   Aerobic/Anaerobic Culture (surgical/deep wound)     Status: None (Preliminary result)   Collection Time: 05/27/16  5:29 PM  Result Value Ref Range Status   Specimen Description ABSCESS LEFT HAND  Final   Special Requests PATIENT ON FOLLOWING ZOSYN  Final   Gram Stain   Final    RARE WBC PRESENT, PREDOMINANTLY MONONUCLEAR NO ORGANISMS SEEN    Culture   Final    NORMAL SKIN FLORA NO ANAEROBES ISOLATED; CULTURE IN PROGRESS FOR 5 DAYS    Report Status PENDING  Incomplete     Labs: CBC:  Recent Labs Lab 05/26/16 1929 05/27/16 0451 05/29/16 1532 05/30/16 0714 05/31/16 0603  WBC 9.7 7.8 5.5 5.5 4.7  NEUTROABS 5.5  --   --   --   --   HGB 15.6 14.8 13.1 12.8* 13.8  HCT 49.6 46.9 40.8 41.9 44.8  MCV 90.7 90.0 90.9 90.9 89.4  PLT 253 234 189 191 436   Basic Metabolic Panel:  Recent Labs Lab 05/26/16 1929 05/27/16 0451 05/29/16 0402 05/29/16 1532  NA 138 138 139  --   K 3.9 4.1 5.2*  --   CL 105 104 108  --   CO2 24 25 25   --   GLUCOSE 98 92 92  --   BUN 6 8 7   --   CREATININE 0.89 0.86 0.71 0.85  CALCIUM 9.5 9.3 9.0  --    Liver Function Tests:  Recent Labs Lab 05/26/16 1929  AST 33  ALT 62  ALKPHOS 62  BILITOT 0.7  PROT 7.1  ALBUMIN 4.3   Time spent: 30 minutes  Signed:  [name], PRANAV  [facility]  05/31/2016  , 11:05 AM

## 2016-05-31 NOTE — Progress Notes (Signed)
[facility] is available to take report. The person who took my call stated nurses are still busy, asked if I can speak with the supervisor but the other line stated they are busy. Will call again later.

## 2016-05-31 NOTE — Anesthesia Postprocedure Evaluation (Signed)
Anesthesia Post Note  Patient: Derrick Rivers  Procedure(s) Performed: Procedure(s) (LRB): IRRIGATION AND DEBRIDEMENT, FASCIOTOMY LEFT HAND (Left)  Patient location during evaluation: PACU Anesthesia Type: General Level of consciousness: awake, awake and alert and oriented Pain management: pain level controlled Vital Signs Assessment: post-procedure vital signs reviewed and stable Respiratory status: spontaneous breathing, nonlabored ventilation and respiratory function stable Cardiovascular status: blood pressure returned to baseline Anesthetic complications: no    Last Vitals:  Filed Vitals:   05/12/16 1000 05/12/16 1344  BP: 141/64 120/68  Pulse: 55 63  Temp: 36.8 C 36.7 C  Resp:  18    Last Pain:  Filed Vitals:   05/12/16 1640  PainSc: 7                  Ron Beske COKER

## 2016-05-31 NOTE — Progress Notes (Signed)
[facility] but [facility] not available to take report. Will try again later.

## 2016-05-31 NOTE — Progress Notes (Signed)
Case management notified about disharge and CM stated no other needs at this time but to call report if discharge.

## 2016-05-31 NOTE — Progress Notes (Signed)
[facility] stated that at this time, no bed is available for the patient. MD made aware .

## 2016-06-01 LAB — AEROBIC/ANAEROBIC CULTURE (SURGICAL/DEEP WOUND)

## 2016-06-01 LAB — AEROBIC/ANAEROBIC CULTURE W GRAM STAIN (SURGICAL/DEEP WOUND): Culture: NORMAL

## 2016-06-01 NOTE — Progress Notes (Signed)
[facility] PROGRESS NOTE  Patient: Derrick Rivers ZHY:[id number]   PCP: Pcp Not In System DOB: 03-11-1981   DOA: 05/26/2016   DOS: 06/01/2016    Subjective: Continue to complain of pain as well as diarrhea.  Objective: No abdominal tenderness on examination, bowel sounds present, S1 and S2 present. No edema   Assessment and plan: Continue current management. Continues to remain stable [facility]  Author: Lynden OxfordPranav Ohanna Gassert, MD [facility] Pager: [phone] 06/01/2016 6:46 PM   If 7PM-7AM, please contact night-coverage at [email or web address], password [email or web address]

## 2016-06-01 NOTE — Care Management (Signed)
CM spoke with Dr. Allena KatzPatel in reference to patient discharge. [name] states [facility] will not have a doctor coming in to discharge patients today. The do not have a bed available. Suggested a call back on tomorrow. Physicist, medicalCrystal Deniqua Perry RN BSN CCM

## 2016-06-01 NOTE — Progress Notes (Signed)
[facility] at [phone] and nurse instructed us to call back on Monday 06/02/16 for bed availability. No bed available as of today.  Derrick Rivers, [name] n 06/01/2016 11:46 AM

## 2016-06-02 NOTE — Consult Note (Addendum)
[facility]OC follow-up: Removed Vac dressing to change; full thickness wound to left hand is 100% beefy red, 4X1.2X.2cm.  No odor, scant amt yellow drainage.  Pt states he would rather have a dressing change at this time instead of the wound Vac if the site is stable. This would also [facility] Called Dr Merlyn LotKuzma to discuss the plan of care.  He ordered the Vac to be discontinued and moist gauze dressing be applied and changed Q day.  Discussed the plan of care with the case manager and the patient and he verbalized understanding. Please re-consult if further assistance is needed.  Thank-you,  Derrick Mcgeeawn Merrel Crabbe MSN, RN, CWOCN, OppWCN-AP, CNS [phone]

## 2016-06-02 NOTE — Care Management (Addendum)
[facility] [phone] or [phone]  , no beds available at present , they are expecting some discharges today . They will call MD [phone] or [facility] nursing station when bed becomes available .  Ronny FlurryHeather Poppi Scantling RN BSN [phone]

## 2016-06-02 NOTE — Progress Notes (Signed)
Derrick Rivers to [facility] per MD order.  Discussed with the patient and all questions fully answered.  VSS. Dressing to left arm clean dry and intact. IV catheter discontinued intact. Site without signs and symptoms of complications. Dressing and pressure applied.  An After Visit Summary was printed and given to the patient. [facility] and hard copies of prescriptions given to patient.  D/c education completed with patient/family including follow up instructions, medication list, d/c activities limitations if indicated, with other d/c instructions as indicated by MD - patient able to verbalize understanding, all questions fully answered.   Patient instructed to return to ED, call 911, or call MD for any changes in condition.   [facility] with gaurd.  L'ESPERANCE, [name] 06/02/2016 12:28 PM

## 2016-06-02 NOTE — Progress Notes (Signed)
11:54 AM  [facility] and contact number given for them to call back to obtain.  Report given to Dixie Dialsarole Frick ,RN [facility] 12:08 PM

## 2016-06-02 NOTE — Discharge Summary (Signed)
[facility] Discharge Summary   Patient: Derrick Rivers BPZ:[id number]   PCP: Pcp Not In System DOB: [birth year]   Date of admission: 05/26/2016   Date of discharge:  06/02/2016    Discharge Diagnoses:  Principal Problem:   Abscess of left hand Active Problems:   Polysubstance abuse   HTN (hypertension)   Tobacco use disorder   Diarrhea  Admitted From: [place] facility, Dr. Levell July office Disposition:  [facility]  Recommendations for Outpatient Follow-up:  1. Follow-up with Dr. Levell July office in one week  Recommend judicious use of IV narcotics in the future admissions.  Follow-up Information    Follow up with Tennis Must, MD. Schedule an appointment as soon as possible for a visit in 1 week.   Specialty:  Orthopedic Surgery   Why:  for [facility] changes   Contact information:   [place] [address] [phone]      Diet recommendation: Regular diet  Activity: The patient is advised to gradually reintroduce usual activities.  Discharge Condition: good  Code Status: Full code  History of present illness: As per the H and P dictated on admission, "Derrick Rivers is a 35 y.o. male with medical history significant of polysubstance abuse, currently inmate, presented from Dr. Levell July office for worsening left 10 pain swelling erythema. Mr Viegas reporting that he developed of infection over the dorsal aspect of his left hand about 3 weeks ago, coming progressively worse. This progressed to a left hand abscess for which Dr. Fredna Dow of orthopedic surgery performed incision and drainage on 05/01/2016. Over the past 3 days his left hand has become increasingly painful having significant erythema swelling. He states unable to flex or extend his fingers. He saw Dr. Fredna Dow today in the office he recommended readmitted to the hospital for further evaluation and treatment. Mr. Dovidio reports associated fevers, chills, nausea without vomiting.  "  Hospital Course:  Summary of his active problems in the hospital is as following.  1. RecurrentAbscess of left hand. -s/p I&D and wound VAC placement on 05/27/2016 this admission. Prior I&D procedure on 05/08/2016, 05/01/2016, 04/15/2016. -initial wound cx was negative, last month Coag neg staph -on IV vancomycin, status post I&D, wound VAC placed -ID Dr.Campbell consulted -Negative OR cultures, transitioned to oral Augmentin and doxycycline last day 06/06/2016. -discussed with hand surgery who will prefer the patient to discharge with wound VAC, case management consulted to arrange for transfer to [facility], for wound VAC management.  Patient seen disconnecting VAC dressing on his own and recommended not to do it on his own and informed patient's RN regarding any issues that he identifies with the [facility] dressing.  Patient was clearly informed that removing any dressing on his without proper precaution own can cause further infection, and given his recurrent infection best way to heal his scar is wound VAC.  NPWT VAC dressing changes to be performed M/W/F. Supplies needed: NPWT machine, Small VAC dressing kit. 1pc of black foam used to fill the wound bed, sealed with drape at 125 mmHG.  Addendum: On 06/02/2016 Dr Hervey Ard Recommended that the pt does need the wound vac anymore.  Pt wound improved from 1cm depth to 0.2 cm per wound care RN. Pt will be discharged back to [facility] facility with dressing changes per Dr Fredna Dow.   Lindzie Boxx 12:55 PM 06/02/2016    2. Hypertension. Initiate Norvasc 2.5 mg daily.  3. C diff colitis -frequent loose stools for few days and multiple Abx exposures - C.  difficile PCR Ag positive and toxin negative,  - due to profuse diarrhea will Rx with PO vancomycin, last day 06/11/2016.  4. Tobacco use. -counseled to quit smoking  5. Pain management. Patient mentions he has high pain tolerance.  Patient is seen in the hospital multiple times with  abscess of the left hand, undergoing frequent procedures. Based on this finding, based on patient's normal heart rate, and clinically in no apparent distress, while complaining 10 out of 10 pain as well as evidence of well-controlled pain outside of the hospital despite requiring high-dose narcotic in the hospital I feel that the patient does not require IV narcotics. This was discussed with orthopedic Dr. Fredna Dow and they agreed with the assessment and recommended to use IV narcotics only with VAC change. We will continue Percocet only. New prescriptions were given since the patient is getting discharged to another facility.  Recommend judicious use of IV narcotics in the future admissions.  All other chronic medical condition were stable during the hospitalization.  Patient was ambulatory without any assistance. On the day of the discharge the patient's vitals were stable, and no other acute medical condition were reported by patient. the patient was felt safe to be discharge at [facility] with wound VAC.  Procedures and Results: Incision and drainage of left hand wound including sharp removal of skin and subcutaneous tissues with the knife and placement of wound VAC. 05/27/2016  Consultations:  Hand surgery Dr. Fredna Dow  Infectious disease Dr. Jerald Kief MEDICATION: Discharge Medication List as of 06/02/2016 12:27 PM    START taking these medications   Details  amLODipine (NORVASC) 2.5 MG tablet Take 1 tablet (2.5 mg total) by mouth daily., Starting 05/31/2016, Until Discontinued, Normal    doxycycline (VIBRA-TABS) 100 MG tablet Take 1 tablet (100 mg total) by mouth every 12 (twelve) hours., Starting 05/31/2016, Until Fri 06/06/16, Print    famotidine (PEPCID) 20 MG tablet Take 1 tablet (20 mg total) by mouth 2 (two) times daily., Starting 05/31/2016, Until Discontinued, Normal    oxyCODONE (OXY IR/ROXICODONE) 5 MG immediate release tablet Take 1 tablet (5 mg total) by mouth every 6  (six) hours as needed for moderate pain, severe pain or breakthrough pain., Starting 05/31/2016, Until Discontinued, Print    vancomycin (VANCOCIN) 50 mg/mL oral solution Take 2.5 mLs (125 mg total) by mouth 4 (four) times daily., Starting 05/31/2016, Until Wed 06/11/16, Print      CONTINUE these medications which have CHANGED   Details  amoxicillin-clavulanate (AUGMENTIN) 500-125 MG tablet Take 1 tablet (500 mg total) by mouth every 8 (eight) hours., Starting 05/31/2016, Until Fri 06/06/16, Print    cyclobenzaprine (FLEXERIL) 5 MG tablet Take 1 tablet (5 mg total) by mouth 3 (three) times daily as needed for muscle spasms., Starting 05/31/2016, Until Discontinued, Print    oxyCODONE-acetaminophen (PERCOCET/ROXICET) 5-325 MG tablet Take 1 tablet by mouth every 6 (six) hours as needed for severe pain., Starting 05/31/2016, Until Discontinued, Print      CONTINUE these medications which have NOT CHANGED   Details  gabapentin (NEURONTIN) 300 MG capsule Take 900 mg by mouth every 8 (eight) hours. , Until Discontinued, Historical Med    ibuprofen (ADVIL,MOTRIN) 600 MG tablet Take 600 mg by mouth every 6 (six) hours. , Until Discontinued, Historical Med      STOP taking these medications     cephALEXin (KEFLEX) 500 MG capsule      doxycycline (VIBRAMYCIN) 100 MG capsule  Allergies  Allergen Reactions  . Multihance [Gadobenate] Nausea And Vomiting    Questionable, per patient   Discharge Instructions    Diet general    Complete by:  As directed      Discharge instructions    Complete by:  As directed   It is important that you read following instructions as well as go over your medication list with RN to help you understand your care after this hospitalization.  Discharge Instructions: Please follow-up with PCP in one week  Please request your primary care physician to go over all Hospital Tests and Procedure/Radiological results at the follow up,  Please get all Hospital records sent  to your PCP by signing hospital release before you go home.   Do not drive, operating heavy machinery, perform activities at heights, swimming or participation in water activities or provide baby sitting services; until you have been seen by Primary Care Physician or a Neurologist and advised to do so again. Do not take more than prescribed Pain, Sleep and Anxiety Medications. You were cared for by a hospitalist during your hospital stay. If you have any questions about your discharge medications or the care you received while you were in the hospital after you are discharged, you can call the unit and ask to speak with the hospitalist on call if the hospitalist that took care of you is not available.  Once you are discharged, your primary care physician will handle any further medical issues. Please note that NO REFILLS for any discharge medications will be authorized once you are discharged, as it is imperative that you return to your primary care physician (or establish a relationship with a primary care physician if you do not have one) for your aftercare needs so that they can reassess your need for medications and monitor your lab values. You Must read complete instructions/literature along with all the possible adverse reactions/side effects for all the Medicines you take and that have been prescribed to you. Take any new Medicines after you have completely understood and accept all the possible adverse reactions/side effects. Wear Seat belts while driving. If you have smoked or chewed Tobacco in the last 2 yrs please stop smoking and/or stop any Recreational drug use.     Increase activity slowly    Complete by:  As directed           Discharge Exam: Filed Weights   05/26/16 1630  Weight: 122.471 kg (270 lb)   Filed Vitals:   06/01/16 2102 06/02/16 0624  BP: 104/54 134/84  Pulse: 95 91  Temp: 99.3 F (37.4 C) 98.2 F (36.8 C)  Resp: 18 17   General: Appear in no distress, no  Rash; Oral Mucosa moist. Cardiovascular: S1 and S2 Present, no Murmur, no JVD Respiratory: Bilateral Air entry present and Clear to Auscultation, no Crackles, no wheezes Abdomen: Bowel Sound present, Soft and no tenderness Extremities: no Pedal edema, no calf tenderness, wound vac was present at the time of my eval, it was later on removed by RN Neurology: Grossly no focal neuro deficit.  The results of significant diagnostics from this hospitalization (including imaging, microbiology, ancillary and laboratory) are listed below for reference.    Significant Diagnostic Studies: Mr Hand Left W Wo Contrast  05/08/2016  CLINICAL DATA:  Left hand pain and swelling. The patient developed an infection of the left hand approximately 4 weeks ago. Status post incision and drainage of the left hand times two, last on 05/01/2016. EXAM: MRI  OF THE LEFT HAND WITHOUT AND WITH CONTRAST TECHNIQUE: Multiplanar, multisequence MR imaging was performed both before and after administration of intravenous contrast. CONTRAST:  21m MULTIHANCE GADOBENATE DIMEGLUMINE 529 MG/ML IV SOLN COMPARISON:  Plain films left hand 04/15/2016 FINDINGS: Subcutaneous edema and enhancement are identified over the dorsum of the hand. There is a skin defect at approximately the level of the distal third metacarpal most consistent with site of prior incision and drainage. A rim enhancing fluid collection in the subcutaneous tissues over the dorsum of the hand measures 0.9 cm long by 0.3 cm AP by 0.5 cm transverse. The collection is between the second and third metacarpals at the level of the metaphysis of these bones. No other findings to suggest abscess is identified. All intrinsic musculature of the hand demonstrates normal signal in there is no intramuscular fluid collection. Bone marrow signal is normal throughout. IMPRESSION: Findings most consistent with cellulitis of the dorsal soft tissues of the left hand with a possible abscess measuring  0.9 x 0.3 x 0.5 cm between the distal metaphyses of the second and third metacarpals. There is no evidence of myositis or osteomyelitis. Electronically Signed   By: TInge RiseM.D.   On: 05/08/2016 15:36   Dg Hand Complete Left  05/26/2016  CLINICAL DATA:  Cellulitis. Unable to extend fingers. Spider bite 1 month ago with soft tissue infection. EXAM: LEFT HAND - COMPLETE 3+ VIEW COMPARISON:  MRI 05/08/2016.  Radiography 04/15/2016. FINDINGS: Persistent or recurrent generalized soft tissue swelling. No sign of osteomyelitis or radiopaque foreign object. IMPRESSION: Persistent or recurrent generalized soft tissue swelling. Electronically Signed   By: MNelson ChimesM.D.   On: 05/26/2016 22:37    Microbiology: Recent Results (from the past 240 hour(s))  MRSA PCR Screening     Status: None   Collection Time: 05/26/16  5:23 PM  Result Value Ref Range Status   MRSA by PCR NEGATIVE NEGATIVE Final    Comment:        The GeneXpert MRSA Assay (FDA approved for NASAL specimens only), is one component of a comprehensive MRSA colonization surveillance program. It is not intended to diagnose MRSA infection nor to guide or monitor treatment for MRSA infections.   Culture, blood (routine x 2)     Status: None   Collection Time: 05/26/16  6:51 PM  Result Value Ref Range Status   Specimen Description BLOOD RIGHT ANTECUBITAL  Final   Special Requests BOTTLES DRAWN AEROBIC ONLY 5CC  Final   Culture NO GROWTH 5 DAYS  Final   Report Status 05/31/2016 FINAL  Final  Culture, blood (routine x 2)     Status: None   Collection Time: 05/26/16  6:55 PM  Result Value Ref Range Status   Specimen Description BLOOD LEFT HAND  Final   Special Requests BOTTLES DRAWN AEROBIC AND ANAEROBIC 5CC  Final   Culture NO GROWTH 5 DAYS  Final   Report Status 05/31/2016 FINAL  Final  C difficile quick scan w PCR reflex     Status: Abnormal   Collection Time: 05/26/16 10:36 PM  Result Value Ref Range Status   C Diff  antigen POSITIVE (A) NEGATIVE Final   C Diff toxin NEGATIVE NEGATIVE Final   C Diff interpretation   Final    C. difficile present, but toxin not detected. This indicates colonization. In most cases, this does not require treatment. If patient has signs and symptoms consistent with colitis, consider treatment. Requires ENTERIC precautions.  Surgical pcr screen  Status: None   Collection Time: 05/27/16  3:17 PM  Result Value Ref Range Status   MRSA, PCR NEGATIVE NEGATIVE Final   Staphylococcus aureus NEGATIVE NEGATIVE Final    Comment:        The Xpert SA Assay (FDA approved for NASAL specimens in patients over 39 years of age), is one component of a comprehensive surveillance program.  Test performance has been validated by [facility] for patients greater than or equal to 59 year old. It is not intended to diagnose infection nor to guide or monitor treatment.   Aerobic/Anaerobic Culture (surgical/deep wound)     Status: None   Collection Time: 05/27/16  5:29 PM  Result Value Ref Range Status   Specimen Description ABSCESS LEFT HAND  Final   Special Requests PATIENT ON FOLLOWING ZOSYN  Final   Gram Stain   Final    RARE WBC PRESENT, PREDOMINANTLY MONONUCLEAR NO ORGANISMS SEEN    Culture NORMAL SKIN FLORA NO ANAEROBES ISOLATED   Final   Report Status 06/01/2016 FINAL  Final     Labs: CBC:  Recent Labs Lab 05/26/16 1929 05/27/16 0451 05/29/16 1532 05/30/16 0714 05/31/16 0603  WBC 9.7 7.8 5.5 5.5 4.7  NEUTROABS 5.5  --   --   --   --   HGB 15.6 14.8 13.1 12.8* 13.8  HCT 49.6 46.9 40.8 41.9 44.8  MCV 90.7 90.0 90.9 90.9 89.4  PLT 253 234 189 191 028   Basic Metabolic Panel:  Recent Labs Lab 05/26/16 1929 05/27/16 0451 05/29/16 0402 05/29/16 1532  NA 138 138 139  --   K 3.9 4.1 5.2*  --   CL 105 104 108  --   CO2 24 25 25   --   GLUCOSE 98 92 92  --   BUN 6 8 7   --   CREATININE 0.89 0.86 0.71 0.85  CALCIUM 9.5 9.3 9.0  --    Liver Function  Tests:  Recent Labs Lab 05/26/16 1929  AST 33  ALT 62  ALKPHOS 62  BILITOT 0.7  PROT 7.1  ALBUMIN 4.3   Time spent: 30 minutes  Signed:  Raynaldo Falco  [facility]  06/02/2016  , 12:47 PM

## 2016-06-02 NOTE — Care Management Note (Signed)
Case Management Note  Patient Details  Name: Bland SpanJoseph Gullick MRN: [id number] Date of Birth: 1980/12/07  Subjective/Objective:                    Action/Plan: VAC has been discontinued . [name] aware and [name] and [facility] with [facility] .   [facility] . [facility]. Reviewed prescriptions with Ms Steele BergHinson including PO Vancomycin . Ms Steele BergHinson instructed CM to fax prescriptions and discharge summary to [facility] once report called . Same done fax [phone] .   Ms Steele BergHinson instructed CM to have bedside nurse call report [facility] to Nurse Gwendolyn GrantFrick at [phone] ( same done).   Ms Steele BergHinson stated once report called to make  bedside officer aware of [facility] and he will call his officer in charge to receive permission to transport patient.  Bedside nurse aware.  Expected Discharge Date:                  Expected Discharge Plan:  [facility]  In-House Referral:     Discharge planning Services  CM Consult  Post Acute Care Choice:    Choice offered to:     DME Arranged:    DME Agency:     HH Arranged:    HH Agency:     Status of Service:  Completed, signed off  If discussed at [employer] [facility] Stay Meetings, dates discussed:    Additional Comments:  Kingsley PlanWile, [place] Nordquist Marie, RN 06/02/2016, 12:20 PM

## 2016-11-08 DIAGNOSIS — B370 Candidal stomatitis: Secondary | ICD-10-CM

## 2016-11-08 DIAGNOSIS — R51 Headache: Secondary | ICD-10-CM

## 2016-11-08 DIAGNOSIS — A419 Sepsis, unspecified organism: Secondary | ICD-10-CM

## 2016-11-08 DIAGNOSIS — E785 Hyperlipidemia, unspecified: Secondary | ICD-10-CM | POA: Diagnosis not present

## 2016-11-08 DIAGNOSIS — I10 Essential (primary) hypertension: Secondary | ICD-10-CM

## 2016-11-08 DIAGNOSIS — J181 Lobar pneumonia, unspecified organism: Secondary | ICD-10-CM

## 2016-11-08 DIAGNOSIS — E876 Hypokalemia: Secondary | ICD-10-CM

## 2016-11-08 DIAGNOSIS — J45909 Unspecified asthma, uncomplicated: Secondary | ICD-10-CM | POA: Diagnosis not present

## 2016-11-08 DIAGNOSIS — R7309 Other abnormal glucose: Secondary | ICD-10-CM

## 2016-11-09 DIAGNOSIS — J45909 Unspecified asthma, uncomplicated: Secondary | ICD-10-CM | POA: Diagnosis not present

## 2016-11-09 DIAGNOSIS — E785 Hyperlipidemia, unspecified: Secondary | ICD-10-CM | POA: Diagnosis not present

## 2016-11-09 DIAGNOSIS — K5900 Constipation, unspecified: Secondary | ICD-10-CM

## 2016-11-09 DIAGNOSIS — A419 Sepsis, unspecified organism: Secondary | ICD-10-CM | POA: Diagnosis not present

## 2016-11-09 DIAGNOSIS — I10 Essential (primary) hypertension: Secondary | ICD-10-CM | POA: Diagnosis not present

## 2017-08-31 IMAGING — DX DG FOREARM 2V*L*
2 series · 2 of 2 positions shown · non-contrast
Comparison: None.

CLINICAL DATA: Swelling in the forearm.

EXAM:
LEFT FOREARM - 2 VIEW

[forearm ap]
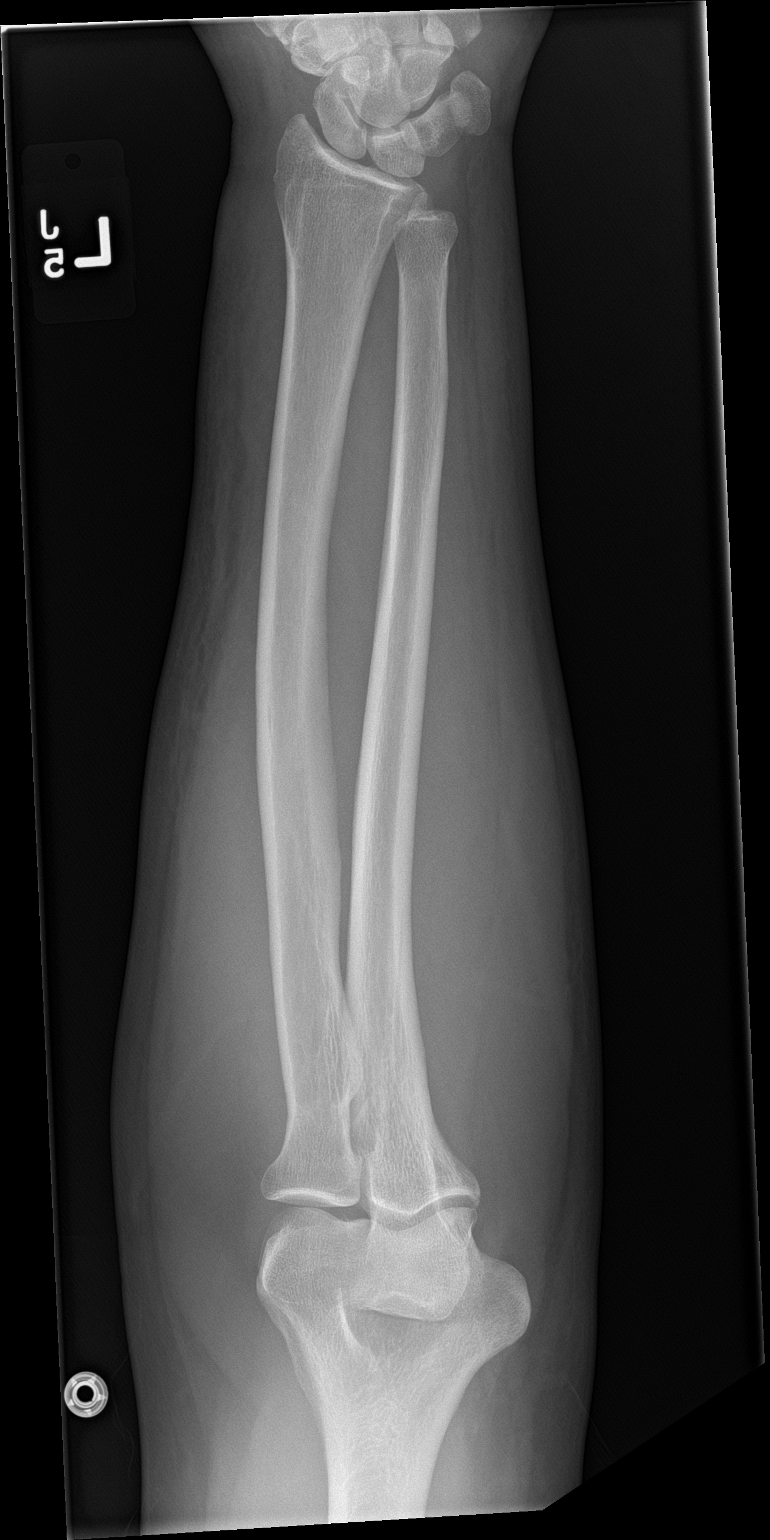

[forearm lat]
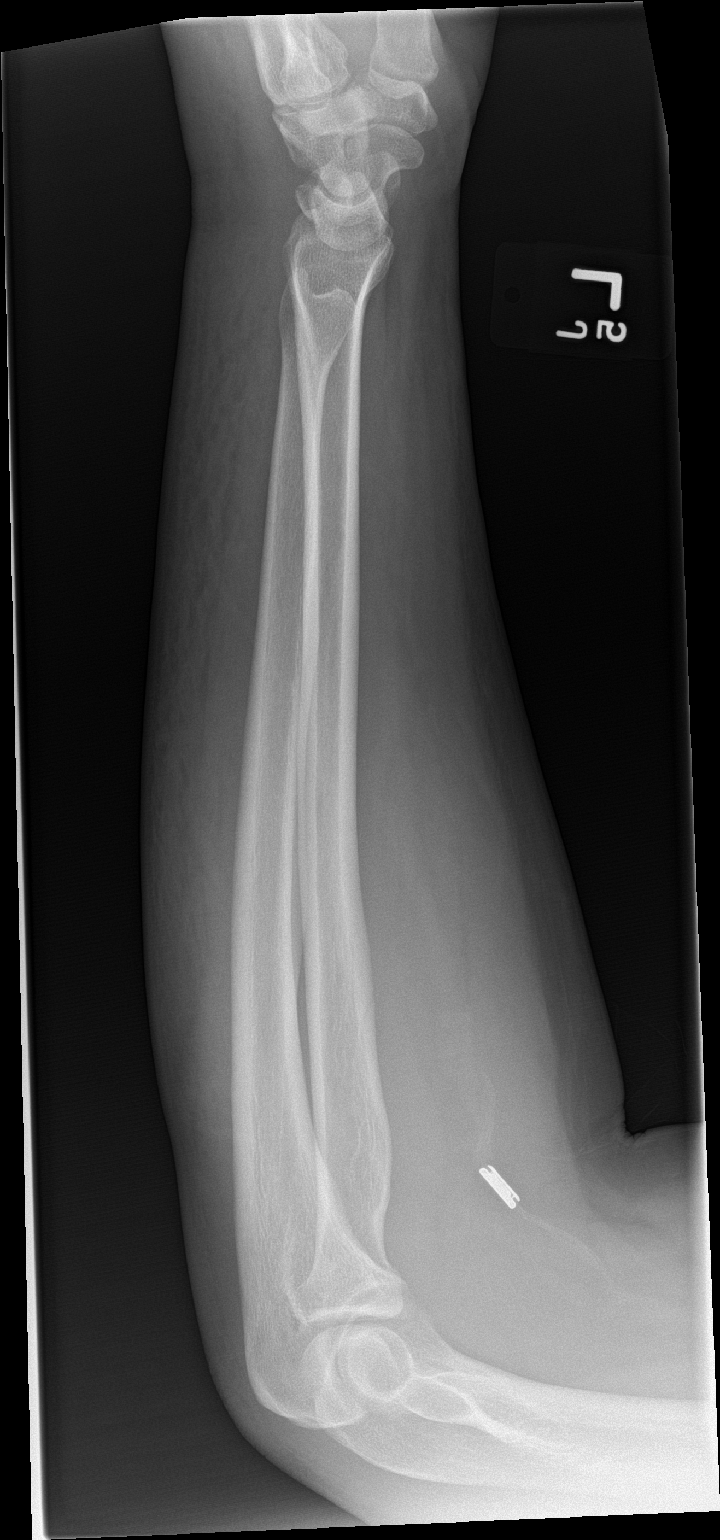

[2 of 2 positions shown; findings below may reference images not displayed]

FINDINGS: The abnormal swelling in the dorsum of the hand and wrist continues
along the dorsum and radial side of the forearm, where there is
infiltration of the subcutaneous tissues along with soft tissue
thickening. No foreign body observed. No underlying bony
abnormality.
IMPRESSION: 1. Abnormal edema tracking in the dorsum and radial side of the
forearm. Cellulitis is not excluded. No gas in the soft tissues
noted.
